# Patient Record
Sex: Male | Born: 1945 | Race: White | Hispanic: No | Marital: Married | Smoking: Former smoker
Health system: Southern US, Academic
[De-identification: ages and names within clinical notes are randomized; demographics above are authoritative.]

## PROBLEM LIST (undated history)

## (undated) DIAGNOSIS — M109 Gout, unspecified: Secondary | ICD-10-CM

## (undated) DIAGNOSIS — Z8669 Personal history of other diseases of the nervous system and sense organs: Secondary | ICD-10-CM

## (undated) DIAGNOSIS — R0602 Shortness of breath: Secondary | ICD-10-CM

## (undated) DIAGNOSIS — E7800 Pure hypercholesterolemia, unspecified: Secondary | ICD-10-CM

## (undated) DIAGNOSIS — E785 Hyperlipidemia, unspecified: Secondary | ICD-10-CM

## (undated) DIAGNOSIS — G629 Polyneuropathy, unspecified: Secondary | ICD-10-CM

## (undated) DIAGNOSIS — I10 Essential (primary) hypertension: Secondary | ICD-10-CM

## (undated) HISTORY — PX: HX APPENDECTOMY: SHX54

## (undated) HISTORY — PX: HX HIP REPLACEMENT: SHX124

## (undated) HISTORY — PX: HX CARPAL TUNNEL RELEASE: SHX101

## (undated) HISTORY — PX: CATARACT EXTRACTION W/  INTRAOCULAR LENS IMPLANT: SHX586

## (undated) NOTE — Patient Instructions (Signed)
Formatting of this note is different from the original.  Images from the original note were not included.      Earwax Blockage: Care Instructions  Your Care Instructions    Earwax is a natural substance that protects the ear canal. Normally, earwax drains from the ears and does not cause problems. Sometimes earwax builds up and hardens. Earwax blockage (also called cerumen impaction) can cause some loss of hearing and pain. When wax is tightly packed, you will need to have your doctor remove it.  Follow-up care is a key part of your treatment and safety. Be sure to make and go to all appointments, and call your doctor if you are having problems. It?s also a good idea to know your test results and keep a list of the medicines you take.  How can you care for yourself at home?   Do not try to remove earwax with cotton swabs, fingers, or other objects. This can make the blockage worse and damage the eardrum.   If your doctor recommends that you try to remove earwax at home:   Soften and loosen the earwax with warm mineral oil. You also can try hydrogen peroxide mixed with an equal amount of room temperature water. Place 2 drops of the fluid, warmed to body temperature, in the ear two times a day for up to 5 days.   Once the wax is loose and soft, all that is usually needed to remove it from the ear canal is a gentle, warm shower. Direct the water into the ear, then tip your head to let the earwax drain out. Dry your ear thoroughly with a hair dryer set on low. Hold the dryer several inches from your ear.   If the warm mineral oil and shower do not work, use an over-the-counter wax softener followed by gentle flushing with an ear syringe each night for a week or two. Make sure the flushing solution is body temperature. Cool or hot fluids in the ear can cause dizziness.  When should you call for help?  Call your doctor now or seek immediate medical care if:   Pus or blood drains from your ear.   Your ears are  ringing or feel full.   You have a loss of hearing.  Watch closely for changes in your health, and be sure to contact your doctor if:   You have pain or reduced hearing after 1 week of home treatment.   You have any new symptoms, such as nausea or balance problems.  Where can you learn more?  Log into your personal health record on [email or web address] and enter Q495 in the "Search Health Library" box to learn more about "Earwax Blockage: Care Instructions."     Not on [facility] yet? Visit [email or web address] and click the registration link to request an activation code.  Current as of: June 08, 2015  Content Version: 11.3   2006-2017 [employer]. Care instructions adapted under license by [facility]. If you have questions about a medical condition or this instruction, always ask your healthcare professional. [employer] disclaims any warranty or liability for your use of this information.    1. Excessive cerumen in right ear canal       Medication benefits, risks, and side effects discussed with patient/family. Patient/family verbalized understanding.     COMMUNICATION/DISCHARGE PLAN:   Debrox drops to soften wax   Take medications as prescribed for entire duration.    Use over-the-counter medication for other symptomatic relief.  Follow up with your primary care physician (No primary care provider on file.)for recheck as soon as possible   Return here or go to Emergency Department if new or worsening symptoms.     Electronically signed by Paula Libra, NP at 02/06/2016 11:04 AM EST

## (undated) NOTE — Progress Notes (Signed)
Formatting of this note is different from the original.  Images from the original note were not included.  Patient: Raymond Myers  MRN:   [id number]   DOB:   1945-09-10  PCP:  No primary care provider on file.     Raymond Myers is a 37 y.o. male who is being seen today for Otalgia (R ear pain (thinks hearing aid rubber tip may be stuck in ear) x 1 day)    HPI Comments: Pt states removed right hearing aid last night and cone was not attached. Pt sts is concerned it is still in his ear canal. Pt states decreased hearing and a feeling of fullness.     Foreign Body in Ear   Incident onset: yesterday. Suspected object: hearing aid cone. The foreign body is suspected to be in the right ear. The incident was reported. The incident was witnessed/reported by the patient. Associated symptoms include hearing loss (ear fullness). Pertinent negatives include no abdominal pain, chest pain, congestion, fever, vomiting or wheezing.     Allergies include:Ace inhibitors and Lisinopril    No outpatient encounter prescriptions on file as of 02/06/2016.     History reviewed. No pertinent past medical history.  History reviewed. No pertinent family history.  History reviewed. No pertinent surgical history.    History   Alcohol use Not on file     History   Smoking Status   ? Never Smoker   Smokeless Tobacco   ? Never Used     History   Drug Use Not on file     Review of Systems   Constitutional: Negative for chills, fatigue and fever.   HENT: Positive for hearing loss (ear fullness). Negative for congestion, ear discharge and ear pain.    Respiratory: Negative for chest tightness, shortness of breath and wheezing.    Cardiovascular: Negative for chest pain, palpitations and leg swelling.   Gastrointestinal: Negative for abdominal pain, nausea and vomiting.   Musculoskeletal: Negative for back pain and neck pain.   Skin: Negative for rash.   Neurological: Negative for dizziness, syncope and weakness.   Psychiatric/Behavioral: Negative  for confusion.     Depression Assessment Review:  No Data Recorded    Vital Signs & Measurements Taken This Encounter  Measurements Weight: 93.4 kg (206 lb), Height:  (185.4 cm), BSA (Calculated - sq m): 2.18 sq meters, BMI (Calculated): 27.2    Vitals BP: (!) 158/84, BP Location: Left arm, Patient Position: Sitting, Pulse: 89, Temp: 98.1 F (36.7 C), Temp src: Temporal    Pulse Oximetry SpO2: 95 % (RA)      No LMP for male patient.      Physical Exam   Constitutional: He is oriented to person, place, and time. He appears well-developed and well-nourished. No distress.   HENT:   Head: Normocephalic and atraumatic.   Right Ear: Tympanic membrane and external ear normal.   Left Ear: Tympanic membrane, external ear and ear canal normal.   Ears:    Nose: Nose normal.   Mouth/Throat: Uvula is midline, oropharynx is clear and moist and mucous membranes are normal.   Eyes: Conjunctivae and EOM are normal. Pupils are equal, round, and reactive to light.   Neck: Normal range of motion. Neck supple.   Cardiovascular: Normal rate, regular rhythm and normal heart sounds.    Pulmonary/Chest: Effort normal and breath sounds normal. No respiratory distress. He has no wheezes. He has no rales.   Musculoskeletal: Normal range of motion.  Lymphadenopathy:     He has no cervical adenopathy.   Neurological: He is alert and oriented to person, place, and time.   Skin: Skin is warm and dry. No rash noted.   Psychiatric: His behavior is normal.   Nursing note and vitals reviewed.    Assessment & Plan    Problem List Items Addressed This Visit     None     Visit Diagnoses     Excessive cerumen in right ear canal    -  Primary    Feared condition not demonstrated         Patient nontoxic appearing, afebrile, VS wnl.  No foreign body noted in right ear, patient with excessive cerumen.  Cerumen removed by curette without difficulty.  Patient states hearing improved.  Patient advised to follow up as needed. Patient verbalized  understanding, agrees with plan. Patient discharged ambulatory, stable from clinic.     Medication benefits, risks, and side effects discussed with patient/family. Patient/family verbalized understanding.     COMMUNICATION/DISCHARGE PLAN:   Debrox drops to soften wax   Take medications as prescribed for entire duration.    Use over-the-counter medication for other symptomatic relief.   Follow up with your primary care physician (No primary care provider on file.)for recheck as soon as possible   Return here or go to Emergency Department if new or worsening symptoms.     Return if symptoms worsen or fail to improve.    Referring Provider (if known): No ref. provider found  Electronically signed by Paula Libra, NP at 02/06/2016 11:22 AM EST

---

## 2013-01-31 DIAGNOSIS — K5730 Diverticulosis of large intestine without perforation or abscess without bleeding: Secondary | ICD-10-CM | POA: Insufficient documentation

## 2013-04-28 ENCOUNTER — Inpatient Hospital Stay (HOSPITAL_COMMUNITY): Payer: Self-pay

## 2016-06-02 DIAGNOSIS — Z9889 Other specified postprocedural states: Secondary | ICD-10-CM | POA: Insufficient documentation

## 2017-10-13 DIAGNOSIS — Z0000 Encounter for general adult medical examination without abnormal findings: Secondary | ICD-10-CM | POA: Insufficient documentation

## 2017-10-25 DIAGNOSIS — L718 Other rosacea: Secondary | ICD-10-CM | POA: Insufficient documentation

## 2017-10-25 DIAGNOSIS — I10 Essential (primary) hypertension: Secondary | ICD-10-CM | POA: Insufficient documentation

## 2017-10-25 DIAGNOSIS — E785 Hyperlipidemia, unspecified: Secondary | ICD-10-CM | POA: Insufficient documentation

## 2017-10-25 DIAGNOSIS — J309 Allergic rhinitis, unspecified: Secondary | ICD-10-CM | POA: Insufficient documentation

## 2017-10-25 DIAGNOSIS — M109 Gout, unspecified: Secondary | ICD-10-CM | POA: Insufficient documentation

## 2017-10-25 DIAGNOSIS — L711 Rhinophyma: Secondary | ICD-10-CM | POA: Insufficient documentation

## 2017-10-25 DIAGNOSIS — Z87891 Personal history of nicotine dependence: Secondary | ICD-10-CM | POA: Insufficient documentation

## 2017-10-25 DIAGNOSIS — Z789 Other specified health status: Secondary | ICD-10-CM | POA: Insufficient documentation

## 2017-11-01 DIAGNOSIS — D485 Neoplasm of uncertain behavior of skin: Secondary | ICD-10-CM | POA: Insufficient documentation

## 2020-11-30 DIAGNOSIS — G5602 Carpal tunnel syndrome, left upper limb: Secondary | ICD-10-CM | POA: Insufficient documentation

## 2021-09-06 DIAGNOSIS — Z872 Personal history of diseases of the skin and subcutaneous tissue: Secondary | ICD-10-CM | POA: Insufficient documentation

## 2021-09-06 DIAGNOSIS — L570 Actinic keratosis: Secondary | ICD-10-CM | POA: Insufficient documentation

## 2021-12-24 DIAGNOSIS — J3089 Other allergic rhinitis: Secondary | ICD-10-CM | POA: Insufficient documentation

## 2021-12-24 DIAGNOSIS — L500 Allergic urticaria: Secondary | ICD-10-CM | POA: Insufficient documentation

## 2022-02-25 DIAGNOSIS — S065X0A Traumatic subdural hemorrhage without loss of consciousness, initial encounter: Secondary | ICD-10-CM | POA: Insufficient documentation

## 2022-03-21 HISTORY — PX: BURR HOLE FOR SUBDURAL HEMATOMA: SHX1275

## 2022-06-21 DIAGNOSIS — I25118 Atherosclerotic heart disease of native coronary artery with other forms of angina pectoris: Secondary | ICD-10-CM

## 2022-07-05 ENCOUNTER — Inpatient Hospital Stay
Admission: RE | Admit: 2022-07-05 | Discharge: 2022-07-05 | Disposition: A | Discharge status: Home / Self Care | Payer: Medicare Other | Source: Ambulatory Visit | Attending: INTERVENTIONAL CARDIOLOGY | Admitting: INTERVENTIONAL CARDIOLOGY

## 2022-07-05 ENCOUNTER — Encounter (HOSPITAL_COMMUNITY)
Admission: RE | Disposition: A | Discharge status: Home / Self Care | Payer: Self-pay | Source: Ambulatory Visit | Attending: INTERVENTIONAL CARDIOLOGY

## 2022-07-05 ENCOUNTER — Other Ambulatory Visit: Payer: Self-pay

## 2022-07-05 ENCOUNTER — Encounter (HOSPITAL_COMMUNITY): Payer: Self-pay | Admitting: INTERVENTIONAL CARDIOLOGY

## 2022-07-05 DIAGNOSIS — R0602 Shortness of breath: Secondary | ICD-10-CM | POA: Insufficient documentation

## 2022-07-05 DIAGNOSIS — R0609 Other forms of dyspnea: Secondary | ICD-10-CM | POA: Insufficient documentation

## 2022-07-05 DIAGNOSIS — I2089 Other forms of angina pectoris (CMS HCC): Secondary | ICD-10-CM | POA: Diagnosis present

## 2022-07-05 DIAGNOSIS — I2510 Atherosclerotic heart disease of native coronary artery without angina pectoris: Secondary | ICD-10-CM

## 2022-07-05 DIAGNOSIS — I25118 Atherosclerotic heart disease of native coronary artery with other forms of angina pectoris: Secondary | ICD-10-CM | POA: Insufficient documentation

## 2022-07-05 HISTORY — DX: Pure hypercholesterolemia, unspecified: E78.00

## 2022-07-05 HISTORY — DX: Essential (primary) hypertension: I10

## 2022-07-05 LAB — CBC WITH DIFF
BASOPHIL #: 0.1 10*3/uL (ref 0.00–0.10)
BASOPHIL %: 1 % (ref 0–1)
EOSINOPHIL #: 0.1 10*3/uL (ref 0.00–0.50)
EOSINOPHIL %: 2 %
HCT: 42.8 % (ref 36.7–47.1)
HGB: 15.1 g/dL (ref 12.5–16.3)
LYMPHOCYTE #: 1.5 10*3/uL (ref 1.00–3.00)
LYMPHOCYTE %: 30 % (ref 16–44)
MCH: 31.8 pg (ref 23.8–33.4)
MCHC: 35.2 g/dL (ref 32.5–36.3)
MCV: 90.4 fL (ref 73.0–96.2)
MONOCYTE #: 0.4 10*3/uL (ref 0.30–1.00)
MONOCYTE %: 9 % (ref 5–13)
MPV: 7.5 fL (ref 7.4–11.4)
NEUTROPHIL #: 2.9 10*3/uL (ref 1.85–7.80)
NEUTROPHIL %: 58 % (ref 43–77)
PLATELETS: 185 10*3/uL (ref 140–440)
RBC: 4.74 10*6/uL (ref 4.06–5.63)
RDW: 14.2 % (ref 12.1–16.2)
WBC: 5 10*3/uL (ref 3.6–10.2)

## 2022-07-05 LAB — BASIC METABOLIC PANEL
ANION GAP: 9 mmol/L (ref 4–13)
BUN/CREA RATIO: 13 (ref 6–22)
BUN: 12 mg/dL (ref 7–25)
CALCIUM: 9.1 mg/dL (ref 8.6–10.3)
CHLORIDE: 108 mmol/L — ABNORMAL HIGH (ref 98–107)
CO2 TOTAL: 21 mmol/L (ref 21–31)
CREATININE: 0.93 mg/dL (ref 0.60–1.30)
ESTIMATED GFR: 84 mL/min/{1.73_m2} (ref >59)
GLUCOSE: 107 mg/dL (ref 74–109)
OSMOLALITY, CALCULATED: 276 mOsm/kg (ref 270–290)
POTASSIUM: 4.1 mmol/L (ref 3.5–5.1)
SODIUM: 138 mmol/L (ref 136–145)

## 2022-07-05 LAB — PTT (PARTIAL THROMBOPLASTIN TIME): APTT: 37 seconds (ref 25.0–38.0)

## 2022-07-05 LAB — PT/INR
INR: 1.02 (ref 0.84–1.10)
PROTHROMBIN TIME: 11.8 seconds (ref 9.8–12.7)

## 2022-07-05 LAB — MAGNESIUM: MAGNESIUM: 2.2 mg/dL (ref 1.9–2.7)

## 2022-07-05 SURGERY — CORONARY ANGIOGRAPHY W/LEFT HEART CATH W/WO LVG
Anesthesia: IV Sedation (Nurse Monitored)

## 2022-07-05 MED ORDER — ASPIRIN 81 MG CHEWABLE TABLET
CHEWABLE_TABLET | ORAL | Status: AC
Start: 2022-07-05 — End: 2022-07-05
  Filled 2022-07-05: qty 4

## 2022-07-05 MED ORDER — LIDOCAINE HCL 10 MG/ML (1 %) INJECTION SOLUTION
Freq: Once | INTRAMUSCULAR | Status: DC | PRN
Start: 2022-07-05 — End: 2022-07-05
  Administered 2022-07-05: 5 mL via INTRADERMAL

## 2022-07-05 MED ORDER — ATORVASTATIN 40 MG TABLET
20.0000 mg | ORAL_TABLET | Freq: Every evening | ORAL | 1 refills | Status: AC
Start: 2022-07-05 — End: ?

## 2022-07-05 MED ORDER — ADENOSINE (FFR) 180 MG IN 90 ML NS (TOT VOL) - 2 MG/ML INJECTION
INTRAVENOUS | Status: DC | PRN
Start: 2022-07-05 — End: 2022-07-05
  Administered 2022-07-05 (×2): 140 ug/kg/min via INTRAVENOUS
  Administered 2022-07-05 (×2): 0 via INTRAVENOUS

## 2022-07-05 MED ORDER — SODIUM CHLORIDE 0.9 % (FLUSH) INJECTION SYRINGE
3.0000 mL | INJECTION | Freq: Three times a day (TID) | INTRAMUSCULAR | Status: DC
Start: 2022-07-05 — End: 2022-07-05

## 2022-07-05 MED ORDER — NITROGLYCERIN 100 MCG/ML IN NS INJECTION
INJECTION | Freq: Once | INTRAVENOUS | Status: DC | PRN
Start: 2022-07-05 — End: 2022-07-05
  Administered 2022-07-05: 200 ug via INTRA_ARTERIAL

## 2022-07-05 MED ORDER — VERAPAMIL 2.5 MG/ML INTRAVENOUS SOLUTION
Freq: Once | INTRAVENOUS | Status: DC | PRN
Start: 2022-07-05 — End: 2022-07-05
  Administered 2022-07-05: 5 mg via INTRA_ARTERIAL

## 2022-07-05 MED ORDER — HEPARIN (PORCINE) 1,000 UNIT/ML INJECTION SOLUTION
Freq: Once | INTRAMUSCULAR | Status: DC | PRN
Start: 2022-07-05 — End: 2022-07-05
  Administered 2022-07-05: 5000 [IU] via INTRAVENOUS

## 2022-07-05 MED ORDER — SODIUM CHLORIDE 0.9 % INTRAVENOUS SOLUTION
INTRAVENOUS | Status: DC
Start: 2022-07-05 — End: 2022-07-05

## 2022-07-05 MED ORDER — SODIUM CHLORIDE 0.9 % (FLUSH) INJECTION SYRINGE
3.0000 mL | INJECTION | INTRAMUSCULAR | Status: DC | PRN
Start: 2022-07-05 — End: 2022-07-05

## 2022-07-05 MED ORDER — FENTANYL (PF) 50 MCG/ML INJECTION SOLUTION
INTRAMUSCULAR | Status: AC
Start: 2022-07-05 — End: 2022-07-05
  Filled 2022-07-05: qty 2

## 2022-07-05 MED ORDER — ASPIRIN 81 MG TABLET,DELAYED RELEASE
81.0000 mg | DELAYED_RELEASE_TABLET | Freq: Every day | ORAL | Status: DC
Start: 2022-07-05 — End: 2023-03-29

## 2022-07-05 MED ORDER — DIPHENHYDRAMINE 25 MG CAPSULE
25.0000 mg | ORAL_CAPSULE | ORAL | Status: AC
Start: 2022-07-05 — End: 2022-07-05
  Administered 2022-07-05: 25 mg via ORAL

## 2022-07-05 MED ORDER — MIDAZOLAM 5 MG/ML INJECTION WRAPPER
INTRAMUSCULAR | Status: AC
Start: 2022-07-05 — End: 2022-07-05
  Filled 2022-07-05: qty 1

## 2022-07-05 MED ORDER — DIPHENHYDRAMINE 25 MG CAPSULE
ORAL_CAPSULE | ORAL | Status: AC
Start: 2022-07-05 — End: 2022-07-05
  Filled 2022-07-05: qty 1

## 2022-07-05 MED ORDER — ADENOSINE (DIAGNOSTIC) 3 MG/ML INTRAVENOUS SOLUTION
INTRAVENOUS | Status: AC
Start: 2022-07-05 — End: 2022-07-05
  Filled 2022-07-05: qty 60

## 2022-07-05 MED ORDER — DIAZEPAM 5 MG TABLET
ORAL_TABLET | ORAL | Status: AC
Start: 2022-07-05 — End: 2022-07-05
  Filled 2022-07-05: qty 1

## 2022-07-05 MED ORDER — ASPIRIN 81 MG CHEWABLE TABLET
324.0000 mg | CHEWABLE_TABLET | Freq: Once | ORAL | Status: AC
Start: 2022-07-05 — End: 2022-07-05
  Administered 2022-07-05: 324 mg via ORAL

## 2022-07-05 MED ORDER — MIDAZOLAM 5 MG/ML INJECTION WRAPPER
Freq: Once | INTRAMUSCULAR | Status: DC | PRN
Start: 2022-07-05 — End: 2022-07-05
  Administered 2022-07-05 (×5): 1 mg via INTRAVENOUS

## 2022-07-05 MED ORDER — DIAZEPAM 5 MG TABLET
5.0000 mg | ORAL_TABLET | ORAL | Status: AC
Start: 2022-07-05 — End: 2022-07-05
  Administered 2022-07-05: 5 mg via ORAL

## 2022-07-05 MED ORDER — HEPARIN (PORCINE) (PF) 2,000 UNIT/1,000 ML IN 0.9 % SODIUM CHLORIDE IV
INTRAVENOUS | Status: AC
Start: 2022-07-05 — End: 2022-07-05
  Filled 2022-07-05: qty 2000

## 2022-07-05 MED ORDER — VERAPAMIL 2.5 MG/ML INTRAVENOUS SOLUTION
INTRAVENOUS | Status: AC
Start: 2022-07-05 — End: 2022-07-05
  Filled 2022-07-05: qty 2

## 2022-07-05 MED ORDER — SODIUM CHLORIDE 0.9 % INTRAVENOUS SOLUTION
INTRAVENOUS | Status: AC | PRN
Start: 2022-07-05 — End: 2022-07-05
  Administered 2022-07-05: 100 mL/h via INTRAVENOUS

## 2022-07-05 MED ORDER — LIDOCAINE HCL 10 MG/ML (1 %) INJECTION SOLUTION
INTRAMUSCULAR | Status: AC
Start: 2022-07-05 — End: 2022-07-05
  Filled 2022-07-05: qty 50

## 2022-07-05 MED ORDER — FENTANYL (PF) 50 MCG/ML INJECTION WRAPPER
INJECTION | Freq: Once | INTRAMUSCULAR | Status: DC | PRN
Start: 2022-07-05 — End: 2022-07-05
  Administered 2022-07-05 (×2): 25 ug via INTRAVENOUS

## 2022-07-05 SURGICAL SUPPLY — 16 items
CATH ANGIO 5FR PGTL ST CURVE 110CM PERFORMA MIV 6 SH RADOPQ BRD RADIAL PEBAX PLYCRB NYL STRL LF (CATHETERS) ×2 IMPLANT
CATH ANGIO 6FR RADIAL TIG 4 CURVE 100CM OPTITORQUE LRG LUM SH 2 BRD SFT TIP COR SS NYL POLYUR (CATHETERS) ×2 IMPLANT
CATH GD 6FR 100CM EBU3.5 CURVE SHR NX BAL SFT TIP DIST SLEEVE FLXB DIST SEG PERI TRNRDL HDPE STRL (CATHETERS) ×2 IMPLANT
CATH GD 6FR 100CM IR1 CURVE HEARTRAIL III LRG INNER LUM RADOPQ FLXB SHAFT COR PTFE (CATHETERS) ×2 IMPLANT
COVER PROBE 58X5.5IN TELESCOPICAL FOLD ELAS BAND GEL PKT STRL LF  CIV-FLX (MED SURG SUPPLIES) ×2 IMPLANT
DCNTR FLUID DISPENSR BAG BAJ DISP STRL LF  ASPT TRANSF (IV TUBING & ACCESSORIES) ×2 IMPLANT
DEVICE COMPRESS TR BAND 24CM 2 BAL TRNSPR ADJ STRAP REG RADIAL ART (MED SURG SUPPLIES) ×2 IMPLANT
GW EMRLD .035IN 7CM 260CM FLXB END FIX COR EXCH PTFE VAS PERC 3MM RAD J CURVE DISP (WIRE) ×2 IMPLANT
GW GLDWR .035IN 3CM 150CM FLXB ANG TIP RADOPQ KINK RST NITINOL TUNG POLYUR HDRPH VAS STD (WIRE) ×2 IMPLANT
GW RUNTHROUGH .36MM 3CM 180CM RADOPQ X FLPY VAS STRL LF  DISP PTCA (WIRE) ×2 IMPLANT
KIT INTROD 10CM 6FR 22GA GLIDESHEATH SLNDR .021IN PLASTIC SHEATH DIL 2 WL PNCT SHORT ANG MINIWIRE 45 (INTRODUCER) ×2 IMPLANT
MCTH PRESS 150CM NAVVUS II STRL LF  DISP (VASCULAR) ×2 IMPLANT
SCATPAD RAD SHIELD 145 X 165 (MED SURG SUPPLIES) ×2 IMPLANT
SET ADMN MERIT MEDICAL SYS INJ MED CNRST S DISP (VASCULAR) ×2 IMPLANT
TUBING PROCEDURE 72IN RADGR STRL DISP (MED SURG SUPPLIES) ×2 IMPLANT
VALVE HMSTS .096- IN COPLT KIT BLDBCK CONTROL VALVE INTROD TORQUE DEV GW (VASCULAR) ×2 IMPLANT

## 2022-07-05 NOTE — Discharge Instructions (Addendum)
DO NOT LIFT PUSH OR PULL MORE THAN 2 POUNDS FOR 2 DAYS    IF YOU START TO BLEED FROM YOUR WRIST OR DEVELOP A HEMATOMA, HOLD PRESSURE AT THE PROCEDURE SITE AND GET TO AN ER

## 2022-07-05 NOTE — Nurses Notes (Signed)
Reviewed 2 lb restriction with patient multiple times; reinforced with patient and his wife what to do if he starts bleeding or develops a hematoma. Both verbalized understanding. Written instructions and education provided.  Out of facility in wheelchair with all belongings including home medications at 1625

## 2022-07-05 NOTE — Progress Notes (Signed)
07/05/22  Procedure(s):  CORONARY ANGIOGRAPHY W/LEFT HEART CATH W/WO LVG  CARD US GUIDED ACCESS  CORONARY FLOW MEASUREMENT - INITIAL VESSEL    Diagnosis:     Sedation Informed Consent, pre-sedation risk assessment and evaluation completed.  History of previous adverse experiences with sedation/analgesia/anesthesia assessed.  Monitored conscious sedation was administered under my direct supervision by an appropriately trained sedation nurse.  Appropriate Facility and Equipment compliant.      Procedure time out  Timeouts       Jeral Fruit, RN at [facility] Jul 05, 2022 1219 EDT       Timeout Details       Timeout type: Preprocedure              Procedures       Panel 1: CORONARY ANGIOGRAPHY W/LEFT HEART CATH W/WO LVG with Skylan Lara, Jeannett Senior, MD    Panel 1: CARD US GUIDED ACCESS with Becker Christopher, Jeannett Senior, MD              Timeout Questions    Correct patient? Yes  Correct site? Yes  Correct side? N/A  Correct position? Yes  Correct procedure? Yes  Site marked? N/A  H&P note completed? Yes  Consents verified? Yes  Radiology studies available? Yes  Relevant lab results available? Yes  Allergies reviewed? Yes  Are all required blood products & devices for the procedure available? Yes  Is documentation verified? Yes             Staff Present       Physicians  Gregory Barrick, Jeannett Senior, MD Staff  Dillow-Price, Shanda Bumps, RN  Jeral Fruit, RN  Unice Bailey, RT (Elvera Lennox)  Binnie Rail, RN  Lenise Arena, RN              Signing History       Staff Performed Signed    Jeral Fruit, RN Tue Jul 05, 2022 1219 EDT Tue Jul 05, 2022 1219 EDT                            Physician in  and out times  Physicians       Name Panel Role Time Period    Carolanne Mercier, Jeannett Senior, MD Panel 1 Primary 07/05/2022 1155 - 07/05/2022 1311          Sedation Staff       Name Type Time Period    Dillow-Price, Shanda Bumps, RN Invasive Nurse 07/05/2022 1155 - 07/05/2022 1311            Sedation and Procedure Times:  Event Time In   CV Sedation Start 1209   CV Sedation Stop 1302          Proc  Name Event Type Event Time    CORONARY ANGIOGRAPHY W/LEFT HEART CATH W/WO LVG  Incision Start Tue Jul 05, 2022 12:24 PM    CORONARY ANGIOGRAPHY W/LEFT HEART CATH W/WO LVG  Incision Close Tue Jul 05, 2022  1:03 PM    CARD US GUIDED ACCESS  Incision Start Tue Jul 05, 2022 12:24 PM    CARD US GUIDED ACCESS  Incision Close Tue Jul 05, 2022  1:03 PM    CORONARY FLOW MEASUREMENT - INITIAL VESSEL  Incision Start Tue Jul 05, 2022 12:42 PM    CORONARY FLOW MEASUREMENT - INITIAL VESSEL  Incision Close Tue Jul 05, 2022  1:03 PM            Aldrete Scores    Pre Sedation  Activity: 2-->able to move 4 extremities voluntarily or on command  Respiration: 2-->able to breathe and cough freely  Circulation: 2-->BP within 20% of pre-anesthetic level  Consciousness: 2-->fully awake  O2 Saturation: 2-->able to maintain O2 saturation greater than 92% on room air  Dressing: 2-->dry and clean or not applicable  Pain: 2-->pain free  Ambulation: 2-->able to stand up and walk straight, on ordered bedrest, or performing at previous level of functioning  Fasting/Feeding: 2-->able to drink fluids or NPO, minimal nausea/ no vomiting  Urine Output: 2-->has voided, adequate urine output per device, or not applicable  Modified Aldrete Score: 20      Post Sedation                                       Sedation Type: (P) Moderate Sedation     Medications (moderate): (P) Fentanyl, Versed     Unit: (P) [facility] Invasive [facility]  IV Type: (P) Peripheral IV        Effects of Administration: (P) Successful sedation w/o adverse events       Patient was continuously monitored throughout the procedure.  Provider was in attendance throughout sedation.  See Invasive Procedure Log for additional details.    Elam Dutch, MD    Blood loss: minimal  Samples: N/A  Complications: none

## 2022-07-05 NOTE — H&P (Signed)
[facility]  H&P Update Form    Johnney, Scarlata, 77 y.o. male  Encounter Start Date:  07/05/2022  Inpatient Admission Date:   Date of Birth:  03-09-1946    07/05/2022    STOP: IF H&P IS GREATER THAN 30 DAYS FROM SURGICAL DAY COMPLETE NEW H&P IS REQUIRED.     H & P updated the day of the procedure.  1.  H&P completed within 30 days of surgical procedure and has been reviewed within 24 hours of admission but prior to surgery or a procedure requiring anesthesia services by Dr. Alethia Berthold within 30 days of procedure , the patient has been examined, and no change has occured in the patients condition since the H&P was completed.       Change in medications: No                   2.  Patient continues to be appropiate candidate for planned surgical procedure. YES      Elam Dutch, MD

## 2022-07-12 LAB — INVASIVE CARDIOLOGY PROCEDURE: Cath EF Quantitative: 65 %

## 2022-08-18 DIAGNOSIS — I440 Atrioventricular block, first degree: Secondary | ICD-10-CM

## 2022-08-18 DIAGNOSIS — I4510 Unspecified right bundle-branch block: Secondary | ICD-10-CM

## 2022-08-18 DIAGNOSIS — R001 Bradycardia, unspecified: Secondary | ICD-10-CM

## 2022-08-18 DIAGNOSIS — R9431 Abnormal electrocardiogram [ECG] [EKG]: Secondary | ICD-10-CM

## 2022-08-18 LAB — ECG 12 LEAD
Atrial Rate: 50 {beats}/min
Calculated P Axis: 66 degrees
Calculated R Axis: -83 degrees
Calculated T Axis: 41 degrees
PR Interval: 226 ms
QRS Duration: 140 ms
QT Interval: 498 ms
QTC Calculation: 454 ms
Ventricular rate: 50 {beats}/min

## 2022-10-11 IMAGING — MR MRI CERVICAL SPINE WITHOUT CONTRAST
4 of 5 series · 22 of 48 positions shown · IV contrast (gadolinium)
Comparison: None available.

﻿EXAM:  [id number]   MRI CERVICAL SPINE WITHOUT CONTRAST
INDICATION: 77-year-old with bilateral shoulder pain and neck pain.  Sustained trauma due to fall in February 2022.  No history of surgery on C-spine.
TECHNIQUE: Multiplanar, multisequential MRI of the C-spine was performed without gadolinium contrast.

[Series 5: T2 · sagittal · 3.0mm · 0.75mm/px · 8 of 13 slices shown (1 of 2)]
[im 1/13]
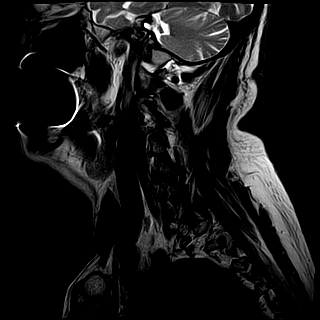
[im 2/13]
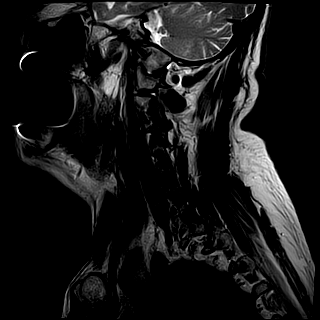
[im 4/13]
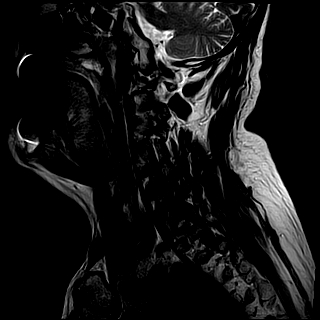
[im 6/13]
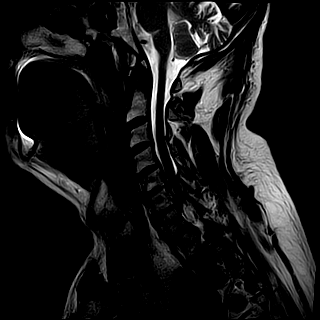
[im 7/13]
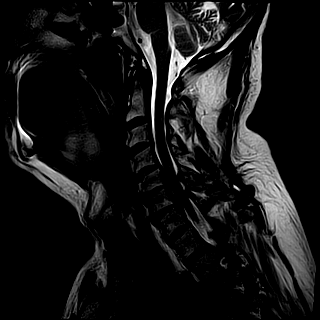
[im 9/13]
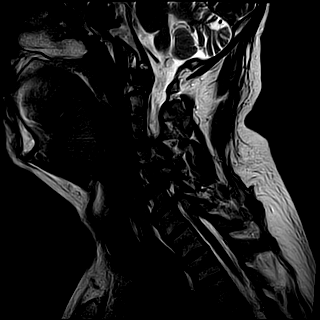
[im 11/13]
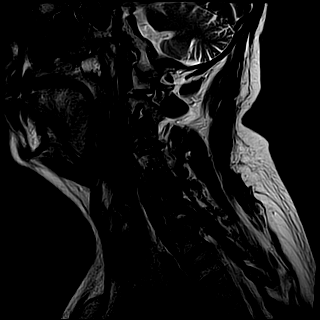
[im 13/13]
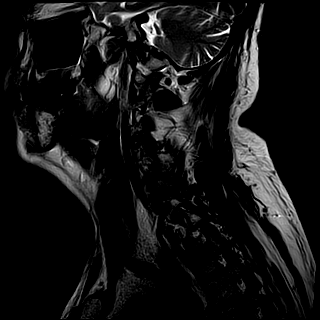

[Series 6: T1 · sagittal · 3.0mm · 0.47mm/px · 3 of 13 slices shown]
[im 2/13]
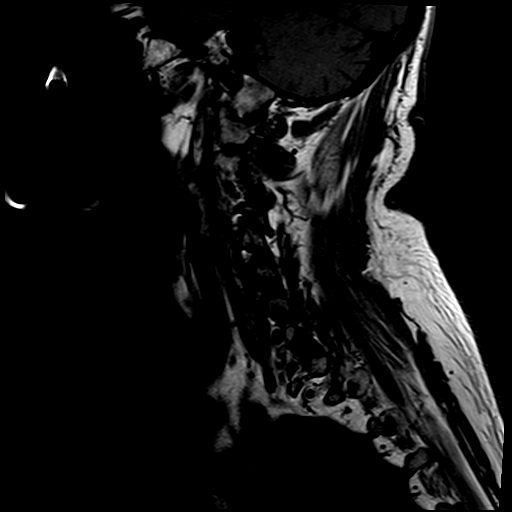
[im 7/13]
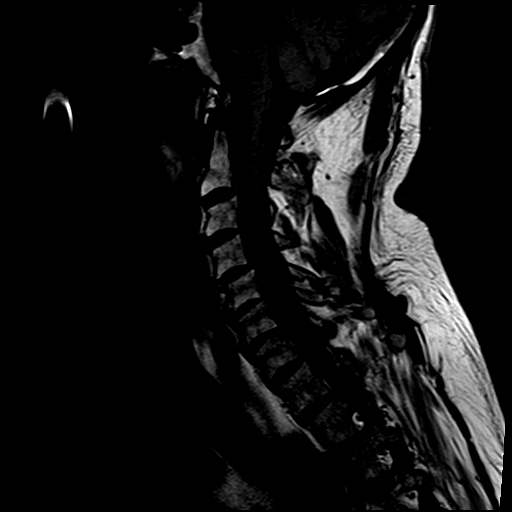
[im 11/13]
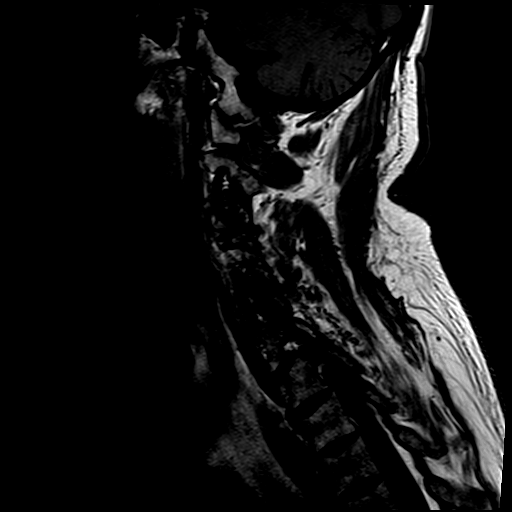

[Series 7: STIR · sagittal · 3.0mm · 0.47mm/px · 3 of 13 slices shown]
[im 2/13]
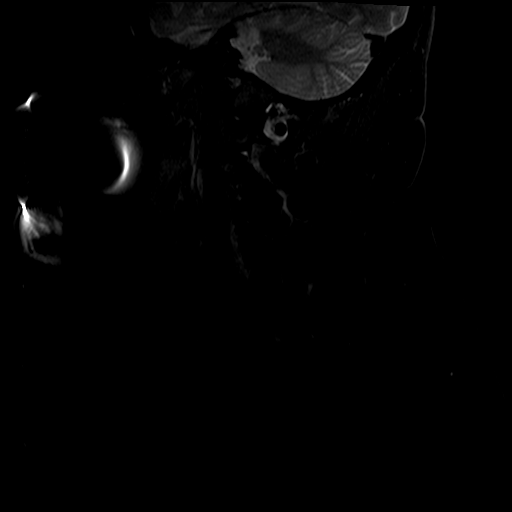
[im 7/13]
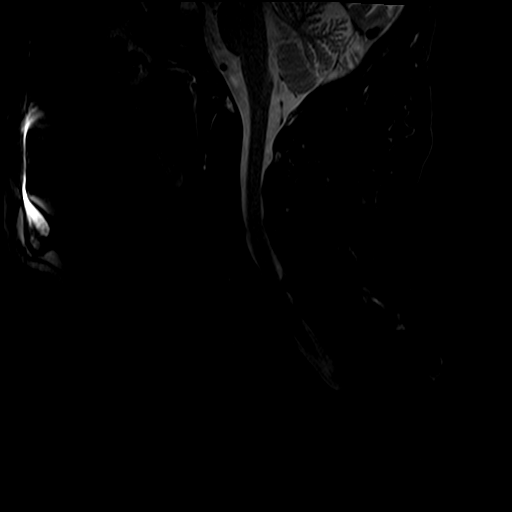
[im 11/13]
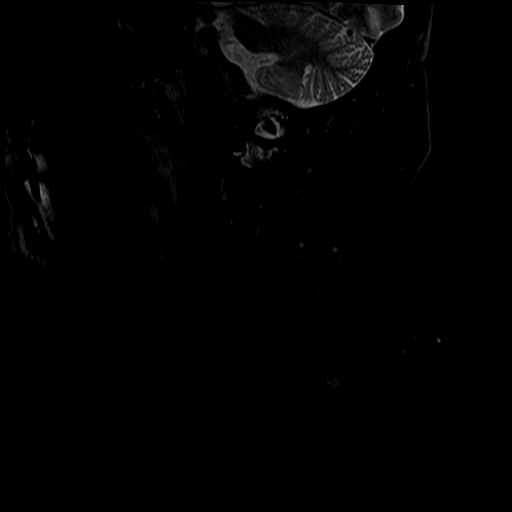

[Series 8: T2 · axial · 3.0mm · 0.39mm/px · z∈[-98,+10]mm · 8 of 18 slices shown (2 of 2)]
[im 1/18]
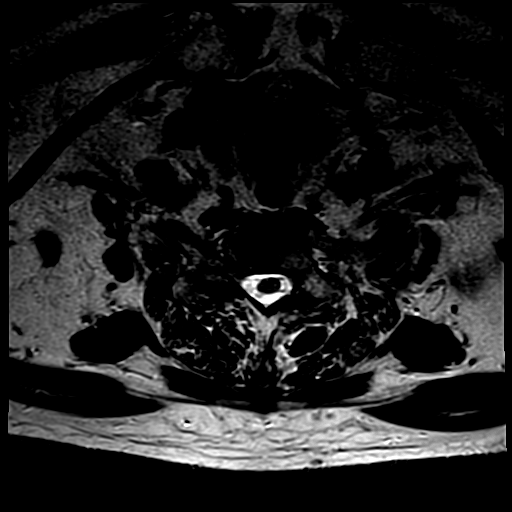
[im 4/18]
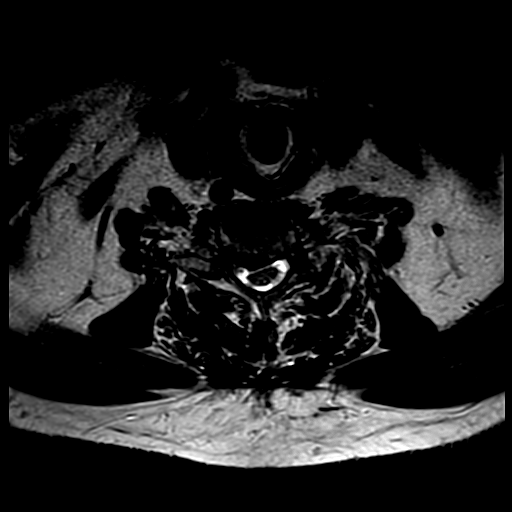
[im 5/18]
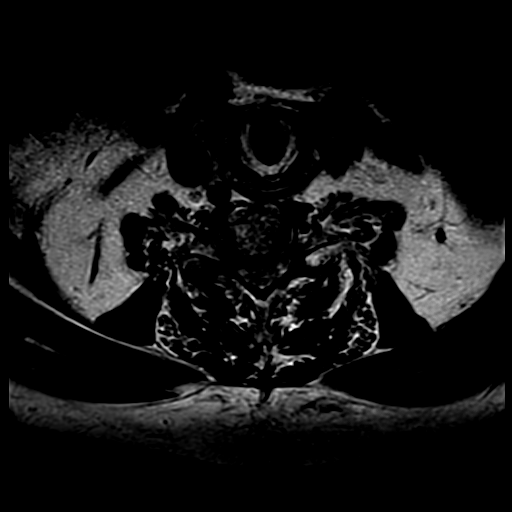
[im 8/18]
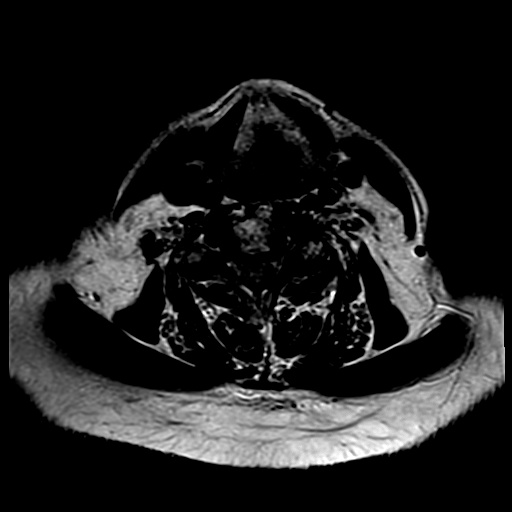
[im 10/18]
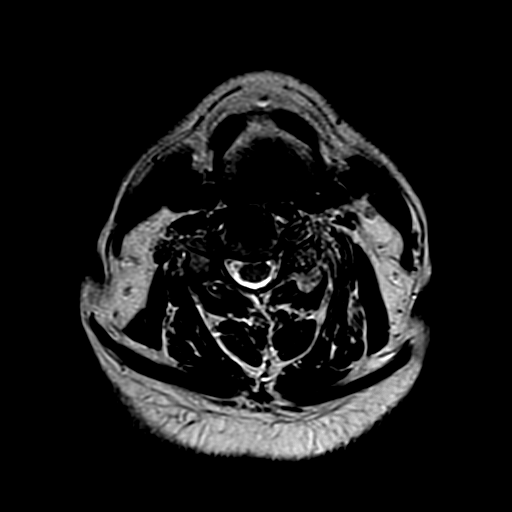
[im 13/18]
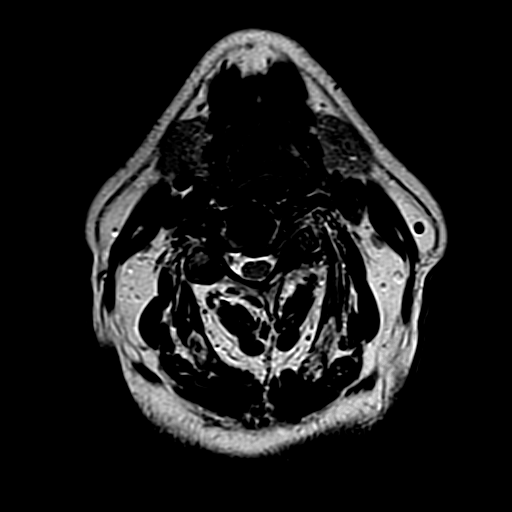
[im 14/18]
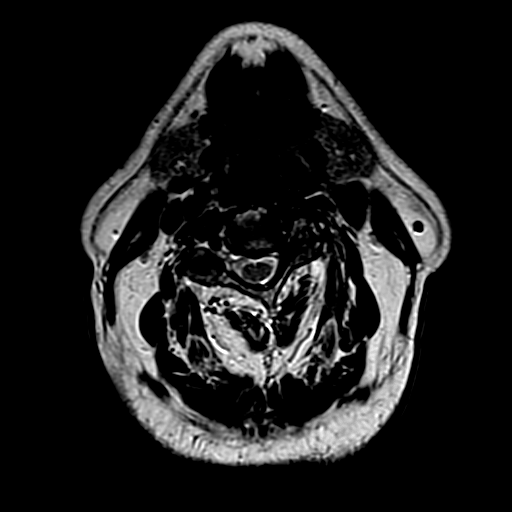
[im 16/18]
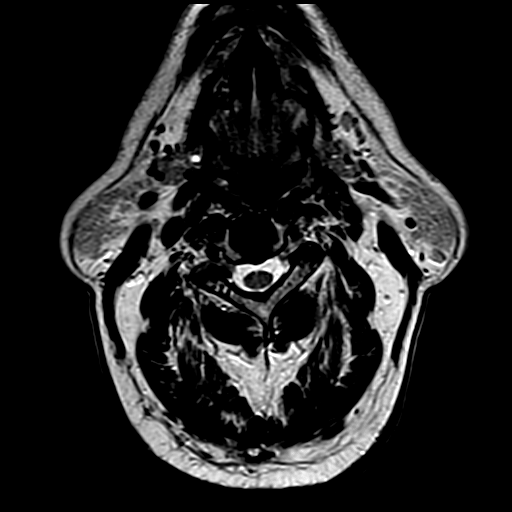

[22 of 48 positions shown; findings below may reference images not displayed]

FINDINGS: No acute bony lesions of cervical vertebrae are seen.  Structures at the foramen magnum are normal in the sagittal projection. 

At C2-3 level, no significant focal disc pathology. Tiny central disc protrusion without compromise of thecal sac or neural foramina. 

At C3-4 level, no significant focal disc lesions are seen.

At C4-5 level, degenerative disc disease with asymmetric bulging annulus to the left with osteophyte complex causing significant left foraminal narrowing at this level.

At C5-6 level, significant degenerative disc disease is noted along with disc osteophyte complex and facet arthropathy causing significant compromise of both neural foramina.  Moderate compromise of thecal sac with AP diameter in the midline measuring 7.9 mm.  The disc changes and dorsal ligament hypertrophy are impinging on the cervical cord at this level.

At C6-7 level, significant degenerative disc disease with bulging annulus and osteophyte complex are noted along with facet arthropathy causing severe compromise of right lateral recess and neural foramen, significant compromise of left neural foramen and compromise of thecal sac with spinal stenosis with AP diameter in the midline measuring 6.5 mm.  These changes are impinging on the cervical spinal cord at this level.

C7-T1 disc shows no focal lesions.  Hypertrophic changes of the facet on the left side are mildly impinging on the left lateral recess.

Cervical spinal cord shows no evidence of cord edema or syrinx formation.
IMPRESSION: 1. No acute or focal bone changes of cervical vertebrae. 

2. At C5-6 level, significant degenerative disc disease is noted along with disc osteophyte complex and facet arthropathy causing significant compromise of both neural foramina.  Moderate compromise of thecal sac with AP diameter in the midline measuring 7.9 mm.  The disc changes and dorsal ligament hypertrophy are impinging on the cervical cord at this level.

3. At C6-7 level, significant degenerative disc disease with bulging annulus and osteophyte complex are noted along with facet arthropathy causing severe compromise of right lateral recess and neural foramen, significant compromise of left neural foramen and compromise of thecal sac with spinal stenosis with AP diameter in the midline measuring 6.5 mm.  These changes are impinging on the cervical spinal cord at this level.

4. Findings at other disc levels are described above in detail.

5. Cervical spinal cord shows no evidence of myelomalacia or syrinx formation.

## 2022-10-11 IMAGING — MR MRI BRAIN W/O CONTRAST
9 series · 48 of 48 positions shown · non-contrast
Comparison: Outside MRI dated 02/25/2022.  Outside CT head dated 04/04/2022.

﻿EXAM:  MRI BRAIN W/O CONTRAST
INDICATION: 77-year-old male with headache. Patient sustained trauma with diagnosis of right-sided subdural hematoma in February 2022, status post burr hole placement and drainage.
TECHNIQUE: Multiplanar, multisequential MRI of the brain was performed without administration of contrast

[Series 5: DWI · axial · 5.0mm · 1.35mm/px · z∈[-54,+72]mm · 15 of 88 slices shown (1 of 3)]
[im 1/88]
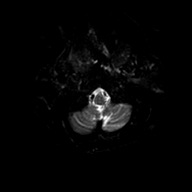
[im 7/88]
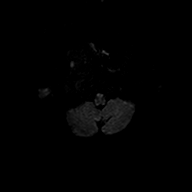
[im 13/88]
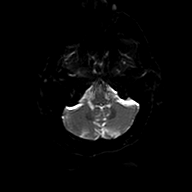
[im 19/88]
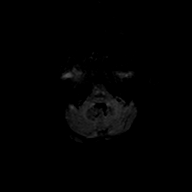
[im 25/88]
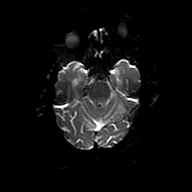
[im 32/88]
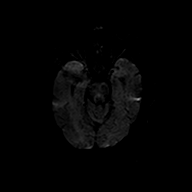
[im 38/88]
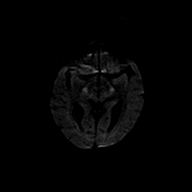
[im 44/88]
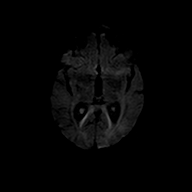
[im 50/88]
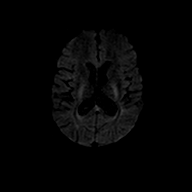
[im 56/88]
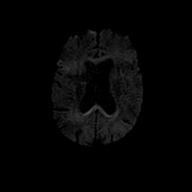
[im 63/88]
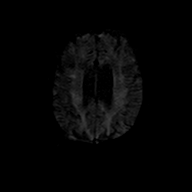
[im 69/88]
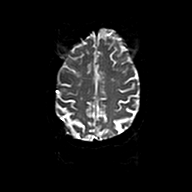
[im 75/88]
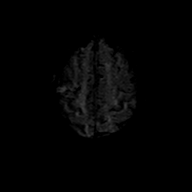
[im 81/88]
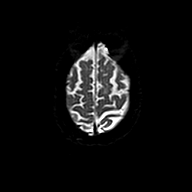
[im 88/88]
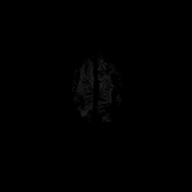

[Series 6: DWI · axial · 5.0mm · 1.35mm/px · z∈[-54,+72]mm · 4 of 22 slices shown (2 of 3)]
[im 1/22]
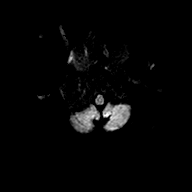
[im 8/22]
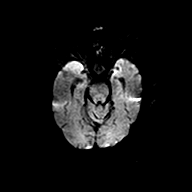
[im 15/22]
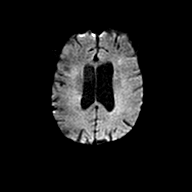
[im 22/22]
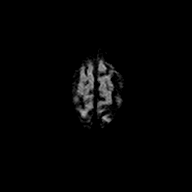

[Series 7: DWI · axial · 5.0mm · 1.35mm/px · z∈[-54,+72]mm · 4 of 22 slices shown (3 of 3)]
[im 1/22]
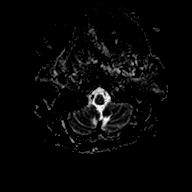
[im 8/22]
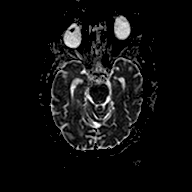
[im 15/22]
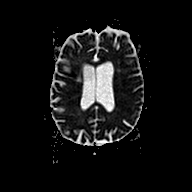
[im 22/22]
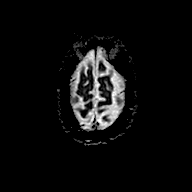

[Series 8: FLAIR · sagittal · 4.0mm · 0.75mm/px · 5 of 26 slices shown (1 of 2)]
[im 1/26]
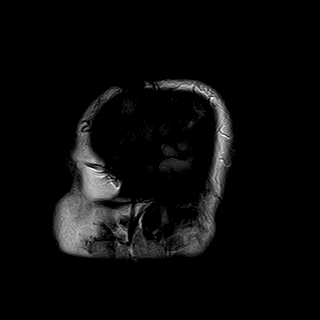
[im 7/26]
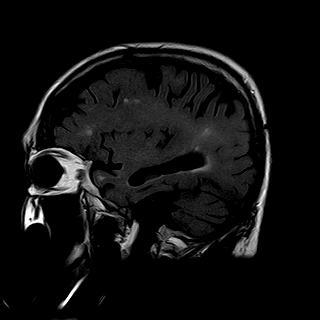
[im 13/26]
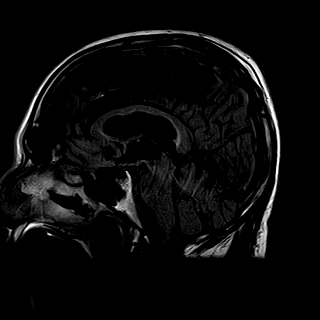
[im 19/26]
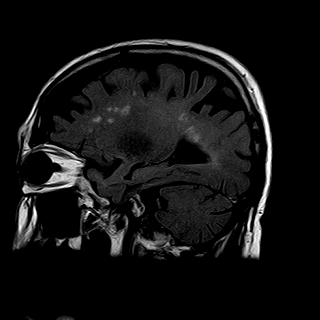
[im 26/26]
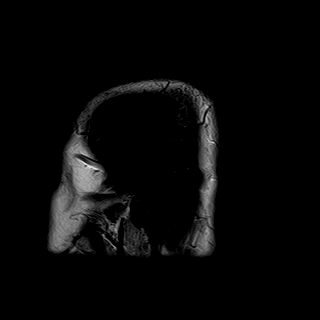

[Series 9: T2 · axial · 5.0mm · 0.43mm/px · z∈[-64,+80]mm · 4 of 25 slices shown (1 of 2)]
[im 1/25]
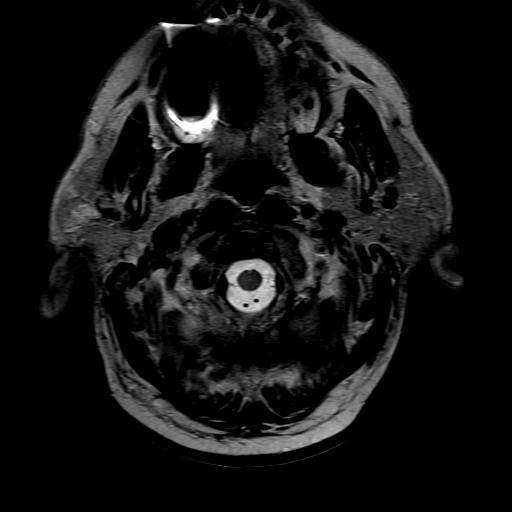
[im 9/25]
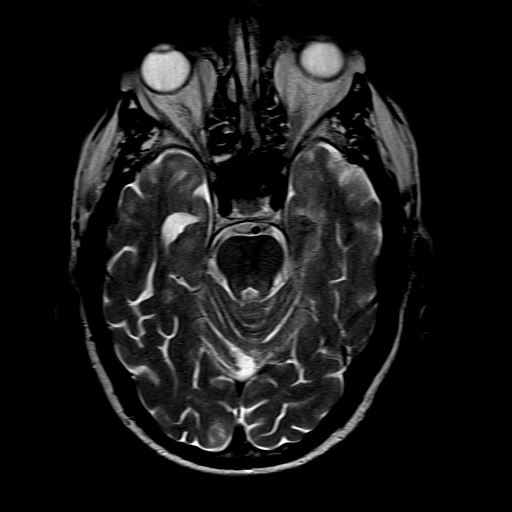
[im 17/25]
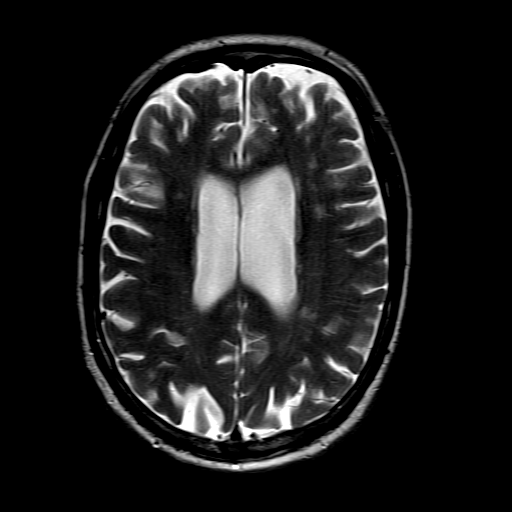
[im 25/25]
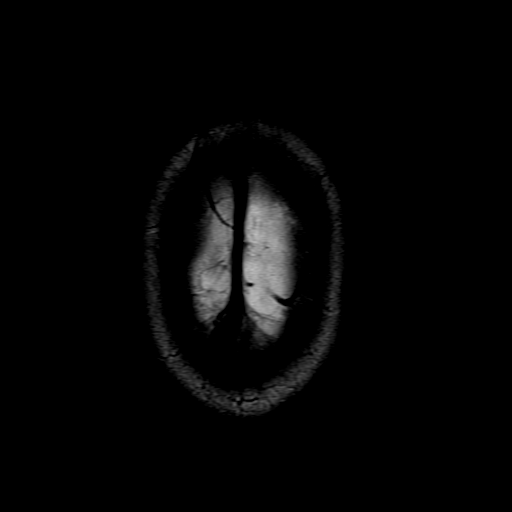

[Series 10: FLAIR · axial · 5.0mm · 0.76mm/px · z∈[-55,+71]mm · 4 of 22 slices shown (2 of 2)]
[im 1/22]
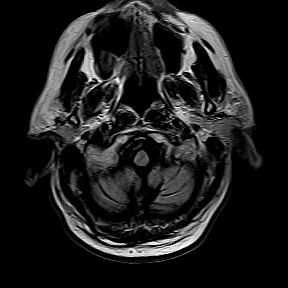
[im 8/22]
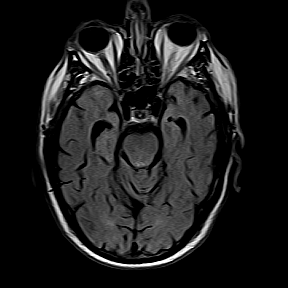
[im 15/22]
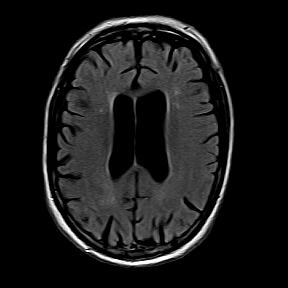
[im 22/22]
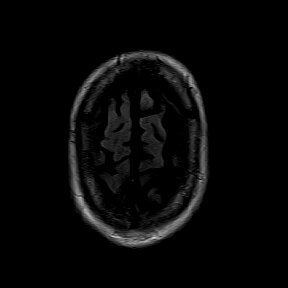

[Series 11: T1 · axial · 5.0mm · 0.69mm/px · z∈[-55,+71]mm · 4 of 22 slices shown]
[im 1/22]
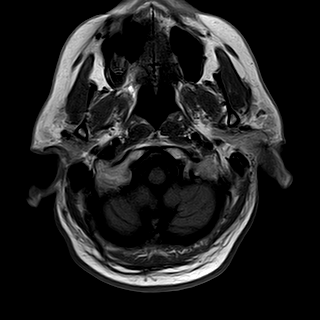
[im 8/22]
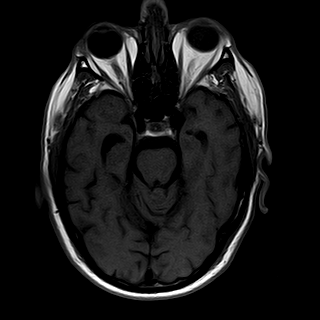
[im 15/22]
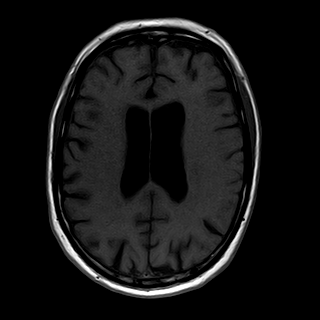
[im 22/22]
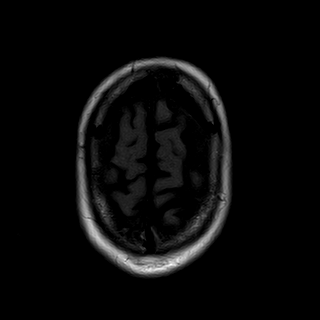

[Series 12: T2-star · axial · 5.0mm · 0.69mm/px · z∈[-55,+71]mm · 4 of 22 slices shown]
[im 1/22]
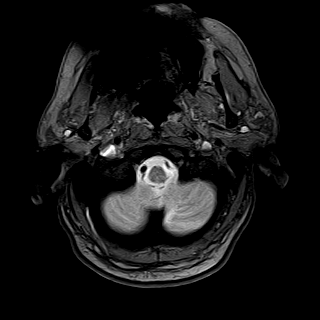
[im 8/22]
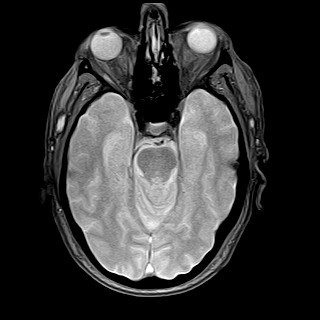
[im 15/22]
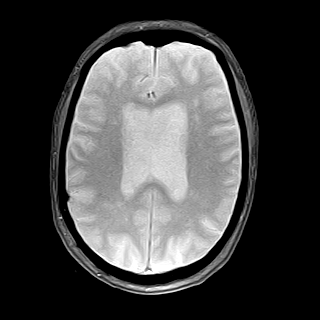
[im 22/22]
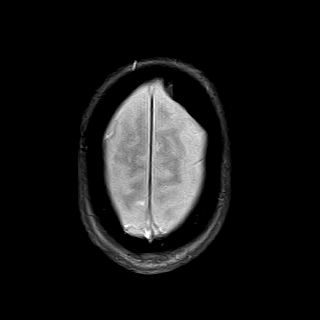

[Series 13: T2 · coronal · 6.0mm · 0.43mm/px · 4 of 24 slices shown (2 of 2)]
[im 1/24]
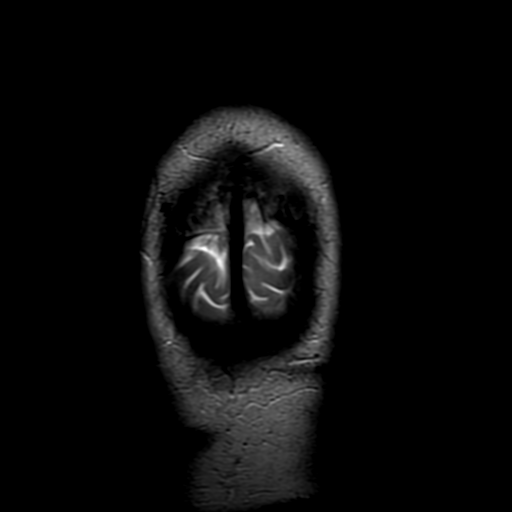
[im 8/24]
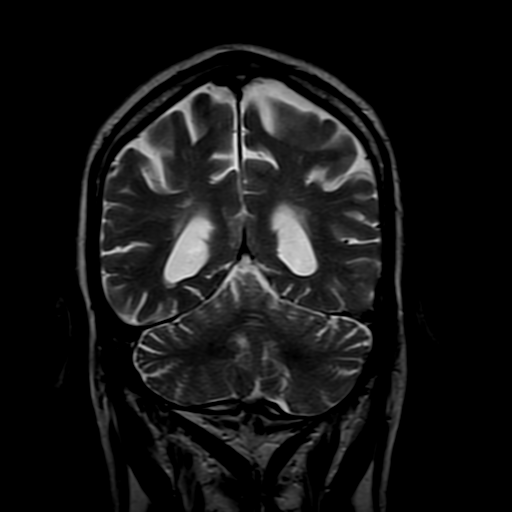
[im 16/24]
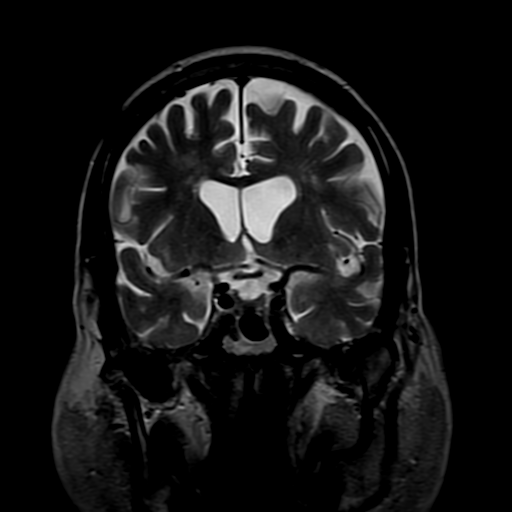
[im 24/24]
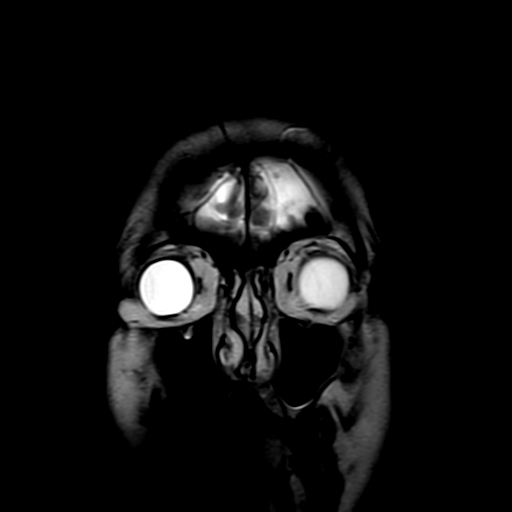

[48 of 48 positions shown; findings below may reference images not displayed]

FINDINGS: No focal areas of restricted diffusion.  No evidence of intracranial bleed or extra-axial collections are seen at this time.  Postoperative changes of burr hole placement on the right side are noted.  Age-appropriate symmetric cerebral cortical atrophy and chronic small-vessel ischemic change of periventricular and subcortical white matter.

Major arteries of circle of Willis and dural venous sinuses are patent.  Hyperostosis of left frontal bone similar to prior imaging studies.  No focal lesions of the posterior fossa.
IMPRESSION: 1. Postsurgical changes of burr hole placement on the right side.  No residual intracranial space-occupying lesions or extra-axial collections are seen.

2. Age-appropriate symmetric cerebral cortical atrophy. Chronic small-vessel ischemic change of periventricular white matter.

## 2023-03-27 ENCOUNTER — Encounter (INDEPENDENT_AMBULATORY_CARE_PROVIDER_SITE_OTHER): Payer: Self-pay | Admitting: Surgery

## 2023-03-29 ENCOUNTER — Other Ambulatory Visit: Payer: Self-pay

## 2023-03-29 ENCOUNTER — Ambulatory Visit (INDEPENDENT_AMBULATORY_CARE_PROVIDER_SITE_OTHER): Payer: 59 | Admitting: Surgery

## 2023-03-29 ENCOUNTER — Encounter (INDEPENDENT_AMBULATORY_CARE_PROVIDER_SITE_OTHER): Payer: Self-pay | Admitting: Surgery

## 2023-03-29 VITALS — BP 151/77 | HR 67 | Temp 97.6°F | Ht 73.0 in | Wt 210.8 lb

## 2023-03-29 DIAGNOSIS — Z800 Family history of malignant neoplasm of digestive organs: Secondary | ICD-10-CM

## 2023-03-29 DIAGNOSIS — R194 Change in bowel habit: Secondary | ICD-10-CM

## 2023-03-29 DIAGNOSIS — R197 Diarrhea, unspecified: Secondary | ICD-10-CM

## 2023-03-30 ENCOUNTER — Encounter (INDEPENDENT_AMBULATORY_CARE_PROVIDER_SITE_OTHER): Payer: Self-pay | Admitting: Surgery

## 2023-03-30 NOTE — H&P (Signed)
Office History and Physical      Reason for Visit: Colonoscopy    History of Present Illness  [name] presents as a referral by [facility] for evaluation of a colonoscopy with family history of colon cancer and personal history of diarrhea with a change in bowel habits.  Symptoms currently partially controlled through the use of Imodium.  Last colonoscopy was sometime ago.  No change in medications.  No nausea or vomiting        I have reviewed the patient's provided medical records and diagnostic testing including laboratory values, imaging results, documented encounters and providers notes with all pertinent information noted with respect to today's evaluation serving as unique tests and sources as a component of the medical decision making process for this encounter relevant to the patients independent evaluation by me today.        Patient Data  Patient History  Past Medical History:   Diagnosis Date    Hypercholesterolemia     Hypertension          Past Surgical History:   Procedure Laterality Date    BURR HOLE FOR SUBDURAL HEMATOMA  03/2022    HX APPENDECTOMY      HX CARPAL TUNNEL RELEASE Bilateral     HX HIP REPLACEMENT           Current Outpatient Medications   Medication Sig    allopurinoL (ZYLOPRIM) 300 mg Oral Tablet Take 1 Tablet (300 mg total) by mouth Once a day    atorvastatin (LIPITOR) 40 mg Oral Tablet Take 0.5 Tablets (20 mg total) by mouth Every evening Indications: hardening of the arteries due to plaque buildup    cholecalciferol, vitamin D3, 25 mcg (1,000 unit) Oral Tablet Take 1 Tablet (1,000 Units total) by mouth Once a day    donepeziL (ARICEPT) 10 mg Oral Tablet Take 2 Tablets (20 mg total) by mouth Every night    doxazosin (CARDURA) 8 mg Oral Tablet Take 1 Tablet (8 mg total) by mouth Once a day    DULoxetine (CYMBALTA DR) 60 mg Oral Capsule, Delayed Release(E.C.) Take 1 Capsule (60 mg total) by mouth Once a day    famotidine (PEPCID) 40 mg  Oral Tablet Take 1 Tablet (40 mg total) by mouth Twice daily    loratadine (CLARITIN) 10 mg Oral Tablet Take 1 Tablet (10 mg total) by mouth Once a day    multivitamin with folic acid (THERA) 400 mcg Oral Tablet Take 1 Tablet by mouth Once a day    NETTLE LEAF ORAL Take 2 Capsules by mouth Twice daily    potassium chloride (K-DUR) 20 mEq Oral Tab Sust.Rel. Particle/Crystal Take 1 Tablet (20 mEq total) by mouth    terbinafine HCL (LAMISIL AT) 1 % Cream Apply topically     Allergies   Allergen Reactions    Indomethacin Anaphylaxis     Other Reaction(s): Not available    Tongue, Face, lips swollen, no shortness of breath    Lisinopril Swelling    Sulfa (Sulfonamides) Rash     Family Medical History:       Problem Relation (Age of Onset)    Heart Attack Father            Social History     Tobacco Use    Smoking status: Former     Types: Cigarettes    Smokeless tobacco: Current     Types: Chew   Substance Use Topics    Alcohol use:  Yes    Drug use: Never        The above documented section regarding past medical, past surgical, family, and social history (PMFSH) has been reviewed and considered and to the best of my knowledge represents a valid and accurate reflection of the patient's previous pertinent experiences documented by multiple providers and participants of the EMR.I cannot attest to all entries but do no recognize any gross inaccuracies as the data is a common field across all providers  Further history pertinent to the current encounter will be found as referenced       Physical Examination:    Vitals:    03/29/23 0953   BP: (!) 151/77   Pulse: 67   Temp: 36.4 C (97.6 F)   SpO2: 97%   Weight: 95.6 kg (210 lb 12.8 oz)   Height: 1.854 m (6\' 1" )   BMI: 27.81          General:appropriate for age. in no acute distress.  HEENT:Atraumatic, Normocephalic.   Lungs:Nonlabored breathing with symmetric expansion  Heart:Regular wth respect to rate and rythmn.    Abdomen:Soft. Nontender. Nondistended  Bowel sounds are  present. No peritoneal signs  Skin:No Rashes. No ulcers. Skin warm  Psychiatric:Alert and oriented to person, place, and time. affect appropriate        Diagnosis:    ICD-10-CM    1. Family history of colon cancer  Z80.0       2. Diarrhea  R19.7       3. Change in bowel habits  R19.4           Plan:    Discussed indications, risks and benefits of colonoscopy with the patient.  Discussed the possibility of polypectomy, biopsies, and possible repeat examinations.  Risks include bleeding, sedation risks, possibility of missed diagnosis of polyp or malignancy, and remote possibilities of perforation and death.  All questions were answered and informed consent was clearly obtained.      This note may have been partially generated using MModal Fluency Direct system, and there may be some incorrect words, spellings, and punctuation that were not noted in checking the note before saving, though effort was made to avoid such errors.    Clide Dales MD FACS RVT  [facility] -General Surgery

## 2023-05-23 ENCOUNTER — Ambulatory Visit (HOSPITAL_COMMUNITY): Admitting: Certified Registered"

## 2023-05-23 ENCOUNTER — Ambulatory Visit (HOSPITAL_COMMUNITY): Admitting: Surgery

## 2023-05-23 ENCOUNTER — Encounter (HOSPITAL_COMMUNITY)
Admission: RE | Disposition: A | Discharge status: Home / Self Care | Payer: Self-pay | Source: Ambulatory Visit | Attending: Surgery

## 2023-05-23 ENCOUNTER — Other Ambulatory Visit: Payer: Self-pay

## 2023-05-23 ENCOUNTER — Ambulatory Visit
Admission: RE | Admit: 2023-05-23 | Discharge: 2023-05-23 | Disposition: A | Discharge status: Home / Self Care | Payer: 59 | Source: Ambulatory Visit | Attending: Surgery | Admitting: Surgery

## 2023-05-23 ENCOUNTER — Encounter (HOSPITAL_COMMUNITY): Payer: Self-pay | Admitting: Surgery

## 2023-05-23 DIAGNOSIS — Z79899 Other long term (current) drug therapy: Secondary | ICD-10-CM | POA: Insufficient documentation

## 2023-05-23 DIAGNOSIS — E785 Hyperlipidemia, unspecified: Secondary | ICD-10-CM | POA: Insufficient documentation

## 2023-05-23 DIAGNOSIS — Z800 Family history of malignant neoplasm of digestive organs: Secondary | ICD-10-CM

## 2023-05-23 DIAGNOSIS — R195 Other fecal abnormalities: Secondary | ICD-10-CM | POA: Insufficient documentation

## 2023-05-23 DIAGNOSIS — I2510 Atherosclerotic heart disease of native coronary artery without angina pectoris: Secondary | ICD-10-CM | POA: Insufficient documentation

## 2023-05-23 DIAGNOSIS — R197 Diarrhea, unspecified: Secondary | ICD-10-CM | POA: Insufficient documentation

## 2023-05-23 DIAGNOSIS — I10 Essential (primary) hypertension: Secondary | ICD-10-CM | POA: Insufficient documentation

## 2023-05-23 DIAGNOSIS — Z87891 Personal history of nicotine dependence: Secondary | ICD-10-CM | POA: Insufficient documentation

## 2023-05-23 DIAGNOSIS — K5730 Diverticulosis of large intestine without perforation or abscess without bleeding: Secondary | ICD-10-CM | POA: Insufficient documentation

## 2023-05-23 DIAGNOSIS — K52832 Lymphocytic colitis: Secondary | ICD-10-CM | POA: Insufficient documentation

## 2023-05-23 DIAGNOSIS — K648 Other hemorrhoids: Secondary | ICD-10-CM | POA: Insufficient documentation

## 2023-05-23 SURGERY — COLONOSCOPY
Anesthesia: General | Wound class: Clean Contaminated Wounds-The respiratory, GI, Genital, or urinary

## 2023-05-23 MED ORDER — LACTATED RINGERS INTRAVENOUS SOLUTION
INTRAVENOUS | Status: DC | PRN
Start: 2023-05-23 — End: 2023-05-23

## 2023-05-23 MED ORDER — PROPOFOL 10 MG/ML INTRAVENOUS EMULSION
INTRAVENOUS | Status: AC
Start: 2023-05-23 — End: 2023-05-23
  Filled 2023-05-23: qty 20

## 2023-05-23 MED ORDER — PROPOFOL 10 MG/ML INTRAVENOUS EMULSION
Freq: Once | INTRAVENOUS | Status: DC | PRN
Start: 2023-05-23 — End: 2023-05-23
  Administered 2023-05-23: 26 mL via INTRAVENOUS

## 2023-05-23 SURGICAL SUPPLY — 2 items
CLEANER INSTRUMENT PRE-KLENZ 13.5 OZ (MISCELLANEOUS PT CARE ITEMS) ×1 IMPLANT
FORCEPS BIOPSY MICROMESH TTH STREAMLINE CATH NEEDLE 240CM 2.4MM RJ 4 SS LRG CPC STRL DISP ORNG 2.8MM (ENDOSCOPIC SUPPLIES) ×1 IMPLANT

## 2023-05-23 NOTE — Anesthesia Postprocedure Evaluation (Signed)
 Anesthesia Post Op Evaluation    Patient: Raymond Myers  Procedure(s):  COLONOSCOPY WITH RANDOM COLONIC BIOPSIES X6    Last Vitals:Temperature: 36.9 C (98.5 F) (05/23/23 1357)  Heart Rate: 62 (05/23/23 1357)  BP (Non-Invasive): 139/60 (05/23/23 1357)  Respiratory Rate: 16 (05/23/23 1357)  SpO2: 94 % (05/23/23 1357)    No notable events documented.    Patient is sufficiently recovered from the effects of anesthesia to participate in the evaluation and has returned to their pre-procedure level.  Patient location during evaluation: PACU       Patient participation: complete - patient participated  Level of consciousness: awake and alert and responsive to verbal stimuli    Pain score: 0  Pain management: adequate  Airway patency: patent    Anesthetic complications: no  Cardiovascular status: acceptable  Respiratory status: acceptable  Hydration status: acceptable  Patient post-procedure temperature: Pt Normothermic   PONV Status: Absent

## 2023-05-23 NOTE — Discharge Instructions (Addendum)
 SURGICAL DISCHARGE INSTRUCTIONS     Dr. Abbe Amsterdam, Ladona Horns, MD  performed your COLONOSCOPY WITH RANDOM COLONIC BIOPSIES X6 today at the [facility]    [facility]:  Monday through Friday from 8 a.m. - 4 p.m.: [phone]    For T&D: [phone]  Between 4 p.m. - 8 a.m., weekends and holidays:  Call ER [phone]    PLEASE SEE WRITTEN HANDOUTS AS DISCUSSED BY YOUR NURSE:  diverticulosis, Hemorrhoids    ANESTHESIA INFORMATION   ANESTHESIA -- ADULT PATIENTS:  You have received intravenous sedation / general anesthesia, and you may feel drowsy and light-headed for several hours. You may even experience some forgetfulness of the procedure. DO NOT DRIVE A MOTOR VEHICLE or perform any activity requiring complete alertness or coordination until you feel fully awake in about 24-48 hours. Do not drink alcoholic beverages for at least 24 hours. Do not stay alone, you must have a responsible adult available to be with you. You may also experience a dry mouth or nausea for 24 hours. This is a normal side effect and will disappear as the effects of the medication wear off.    REMEMBER   If you experience any difficulty breathing, chest pain, bleeding that you feel is excessive, persistent nausea or vomiting or for any other concerns:  Call your physician Dr.  Abbe Amsterdam, Ladona Horns, MD   at [phone] . You may also ask to have the general doctor on call paged. They are available to you 24 hours a day.      SPECIAL INSTRUCTIONS / COMMENTS   Findings today:  Diverticulosis  Hemorrhoids   Random colon biopsies were done. Dr. Abbe Amsterdam will call you with results next week. If he does not do so, please call the office.    FOLLOW-UP APPOINTMENTS   Please call your surgeon's office at the number listed to schedule a date / time of return for follow-up as needed.    Dr Whitney Muse [phone]

## 2023-05-23 NOTE — OR Surgeon (Signed)
 OPERATIVE NOTE    Patient Name: Raymond Myers, Raymond Myers MRN:: [id number]  Date of Birth: 08/01/1945  Date of Service: 05/23/2023     Pre-Operative Diagnosis: Positive Fecal Hemoccult  Diarrhea  Change in Bowel Habits     Post-Operative Diagnosis: Diverticulosis  Hemorrhoids    Procedure(s)/Description:  COLONOSCOPY WITH RANDOM COLONIC BIOPSIES X6: 86578 (CPT)     Attending Surgeon: Clide Dales, MD     Anesthesia Staff:  CRNA: Welton Flakes, CRNA    Anesthesia Type: .General     Estimated Blood Loss:  minimal    Specimens Removed:   ID Type Source Tests Collected by Time Destination   1 : Random Colonic Biopsies x6 Tissue Colon SURGICAL PATHOLOGY SPECIMEN Clide Dales, MD 05/23/2023 1350       Order Name Source Comment Collection Info Order Time   SURGICAL PATHOLOGY SPECIMEN Colon Pre-op diagnosis:  Positive Fecal Hemoccult  Diarrhea  Change in Bowel Habits Collected By: Clide Dales, MD 05/23/2023  1:52 PM     Release to patient   Automated             Complications:  None immediate    Indications for procedure:    [name], [name] is a 78 y.o. male who presents for  colonoscopy . Risks, benefits, indications, and complications of procedure were discussed in great detail and informed signed consent obtained.          Intraoperative Findings:     The Olympus colonoscope was brought to the operating field gently inserted into the patient's rectum and advanced to the level of the cecum under direct visualization without difficulty.  Once this anatomic landmark was reached the scope was slowly retracted with circumferential visualization of all colonic and rectal walls with findings of internal hemorrhoids with sigmoid diverticulosis.  Multiple random colonic biopsies were accomplished with history of diarrhea and change in bowel habits..  There was no evidence of colonic or rectal polyps, tumors or AV malformations.   The colonic and rectal mucosa revealed no significant abnormalities where  visualized.  Retroflexion of the scope revealed the presence of internal hemorrhoids. The prep was overall adequate for diagnosstic exam however very small abnormalities may be missed given the nature of the exam.          Description of Procedure           The patient was brought to the Operating Suite and placed in the supine position on the operating table.  Anesthesia/nursing personnel provided IV access as well as hemodynamic monitoring.  After appropriate lines and leads were placed, the patient was placed in the left lateral decubitus position where they received total IV anesthesia.   After the patient was deemed comfortable, the Olympus colonoscope was brought to the operative field, gently inserted into the patient's rectum, and advanced to the level of the cecum under direct visualization with identification of the cecum through the use of normal anatomic landmarks.  Once the cecum was clearly identified, the scope was slowly retracted with circumferential visualization of all colonic and rectal walls with findings dictated above. In the rectum  the scope was retroflexed to evaluate the anorectal junction for the presence of hemorrhoids.  The scope was slowly straightened, the colon decompressed of as much air as possible and removed without difficulty.  The patient was returned to the post anesthesia care unit in stable condition having tolerated the procedure well.  Disposition:     Further recommendations based on the results of the biopsies    Clide Dales MD FACS RVT  [facility] -General Surgery

## 2023-05-23 NOTE — H&P (Signed)
 General Surgery  History and Physical    Date of Service:    05/23/2023  Date of Admission:  05/23/2023  Date of Birth:  [birth year]  PCP: Cletus Gash    Reason for admission:  Colonoscopy    HPI:  Raymond Myers is a 78 y.o. White male presenting for colonoscopy for family history of colon cancer with a change in bowel habits with diarrhea  Past Medical History:   Diagnosis Date    Hypercholesterolemia     Hypertension       Past Surgical History:   Procedure Laterality Date    BURR HOLE FOR SUBDURAL HEMATOMA  03/2022    HX APPENDECTOMY      HX CARPAL TUNNEL RELEASE Bilateral     HX HIP REPLACEMENT        Social History     Tobacco Use    Smoking status: Former     Types: Cigarettes    Smokeless tobacco: Current     Types: Chew   Substance Use Topics    Alcohol use: Yes    Drug use: Never       Family Medical History:       Problem Relation (Age of Onset)    Heart Attack Father           Medications Prior to Admission       Prescriptions    allopurinoL (ZYLOPRIM) 300 mg Oral Tablet    Take 1 Tablet (300 mg total) by mouth Once a day    amLODIPine (NORVASC) 10 mg Oral Tablet    Take 1 Tablet (10 mg total) by mouth Once a day    atorvastatin (LIPITOR) 40 mg Oral Tablet    Take 0.5 Tablets (20 mg total) by mouth Every evening Indications: hardening of the arteries due to plaque buildup    cholecalciferol, vitamin D3, 25 mcg (1,000 unit) Oral Tablet    Take 1 Tablet (1,000 Units total) by mouth Once a day    donepeziL (ARICEPT) 10 mg Oral Tablet    Take 2 Tablets (20 mg total) by mouth Every night    doxazosin (CARDURA) 8 mg Oral Tablet    Take 1 Tablet (8 mg total) by mouth Once a day    DULoxetine (CYMBALTA DR) 60 mg Oral Capsule, Delayed Release(E.C.)    Take 1 Capsule (60 mg total) by mouth Once a day    famotidine (PEPCID) 40 mg Oral Tablet    Take 1 Tablet (40 mg total) by mouth Twice daily    loratadine (CLARITIN) 10 mg Oral Tablet    Take 1 Tablet (10 mg total) by mouth Once a day    metoprolol  succinate (TOPROL-XL) 25 mg Oral Tablet Sustained Release 24 hr    Take 1 Tablet (25 mg total) by mouth Once a day    multivitamin with folic acid (THERA) 400 mcg Oral Tablet    Take 1 Tablet by mouth Once a day    NETTLE LEAF ORAL    Take 2 Capsules by mouth Twice daily    potassium chloride (K-DUR) 20 mEq Oral Tab Sust.Rel. Particle/Crystal    Take 1 Tablet (20 mEq total) by mouth    terbinafine HCL (LAMISIL AT) 1 % Cream    Apply topically           @ALLERGY    The above documented section regarding past medical, past surgical, family, and social history (PMFSH) has been reviewed and considered and to the best  of my knowledge represents a valid and accurate reflection of the patient's previous pertinent experiences documented by multiple providers and participants of the EMR.I cannot attest to all entries but do no recognize any gross inaccuracies as the data is a common field across all providers  Further history pertinent to the current encounter will be found as referenced    Physical Exam:    Patient Vitals for the past 24 hrs:   Height Weight   05/23/23 0805 1.829 m (6') 95.3 kg (210 lb)          General: appropriate for age. in no acute distress.    HEENT: Atraumatic, Normocephalic.    Lungs: Nonlabored breathing with symmetric expansion    Heart:Regular wth respect to rate and rythmn.    Abdomen:Soft. Nontender. Nondistended     Psychiatric: Alert and oriented to person, place, and time. affect appropriate    Laboratory Data:         Imaging Studies:    Admission and follow-up radiographic findings were reviewed and noted by me at time of evaluation with appropriate decisions made regarding pertinent abnormalities as pertaining to the surgical evaluation and can be found in the patient's electronic medical record    Assessment:     Change in bowel habits   Diarrhea   Family history of colon cancer    Plan:    Discussed indications, risks and benefits of colonoscopy with the patient.  Discussed the  possibility of polypectomy, biopsies, and possible repeat examinations.  Risks include bleeding, sedation risks, possibility of missed diagnosis of polyp or malignancy, and remote possibilities of perforation and death.  All questions were answered and informed consent was clearly obtained.    This note was partially created using voice recognition software and is inherently subject to errors including those of syntax and "sound alike " substitutions which may escape proof reading. In such instances, original meaning may be extrapolated by contextual derivation.    Clide Dales MD FACS RVT  [facility] -General Surgery

## 2023-05-23 NOTE — Anesthesia Preprocedure Evaluation (Addendum)
 ANESTHESIA PRE-OP EVALUATION  Planned Procedure: COLONOSCOPY WITH BIOPSY  Review of Systems     anesthesia history negative               Pulmonary   past history of smoking ,   Cardiovascular    Hypertension, CAD, ECG reviewed, Heart cath 06/2022 reviewed; denies recent chest pain and hyperlipidemia ,No peripheral edema,  Exercise Tolerance: > or = 4 METS   ,beta blocker therapy  ,taken in last 24 hours     GI/Hepatic/Renal   negative GI/hepatic/renal ROS,         Endo/Other         Neuro/Psych/MS    Subdural hematoma Jan 2024     Cancer    negative hematology/oncology ROS,               Physical Assessment      Airway       Mallampati: III    TM distance: >3 FB    Neck ROM: full  Mouth Opening: good.  Facial hair          Dental       Dentition intact             Pulmonary    Breath sounds clear to auscultation  (-) no rhonchi, no decreased breath sounds, no wheezes, no rales and no stridor     Cardiovascular    Rhythm: regular  Rate: Normal  (-) no friction rub, carotid bruit is not present, no peripheral edema and no murmur     Other findings          Plan  ASA 3     Planned anesthesia type: general     total intravenous anesthesia                          Anesthetic plan and risks discussed with patient  signed consent obtained          Patient's NPO status is appropriate for Anesthesia.

## 2023-05-24 ENCOUNTER — Telehealth (INDEPENDENT_AMBULATORY_CARE_PROVIDER_SITE_OTHER): Payer: Self-pay | Admitting: Surgery

## 2023-05-24 DIAGNOSIS — K52832 Lymphocytic colitis: Secondary | ICD-10-CM

## 2023-05-24 LAB — SURGICAL PATHOLOGY SPECIMEN

## 2023-05-30 ENCOUNTER — Ambulatory Visit (HOSPITAL_COMMUNITY): Payer: Self-pay | Admitting: Surgery

## 2023-05-31 NOTE — Result Encounter Note (Signed)
 Patient to return call and schedule appt .Ma Rings, LPN  1/61/0960 45:40

## 2023-06-08 ENCOUNTER — Ambulatory Visit (INDEPENDENT_AMBULATORY_CARE_PROVIDER_SITE_OTHER): Payer: Self-pay | Admitting: Surgery

## 2023-06-08 ENCOUNTER — Encounter (INDEPENDENT_AMBULATORY_CARE_PROVIDER_SITE_OTHER): Payer: Self-pay | Admitting: Surgery

## 2023-06-08 ENCOUNTER — Other Ambulatory Visit: Payer: Self-pay

## 2023-06-08 VITALS — BP 156/72 | HR 53 | Temp 96.8°F | Resp 18 | Ht 73.0 in | Wt 215.0 lb

## 2023-06-08 DIAGNOSIS — K52832 Lymphocytic colitis: Secondary | ICD-10-CM

## 2023-06-08 DIAGNOSIS — R197 Diarrhea, unspecified: Secondary | ICD-10-CM

## 2023-06-10 ENCOUNTER — Encounter (INDEPENDENT_AMBULATORY_CARE_PROVIDER_SITE_OTHER): Payer: Self-pay | Admitting: Surgery

## 2023-06-10 NOTE — Progress Notes (Signed)
 Office Post-op visit      Reason for Visit: Post Op (Colon results)    History of Present Illness  Raymond Myers presents  for follow- up status post colonoscopy with random colonic biopsies.  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  Feels that it was secondary of nettle leaves was a supplementary was using.  No other complaints    I have reviewed the patient's provided medical records and diagnostic testing including laboratory values, imaging results, documented encounters and providers notes with all pertinent information noted with respect to today's evaluation serving as unique tests and sources as a component of the medical decision making process for this encounter relevant to the patients independent evaluation by me today.        Patient Data  Patient History  Past Medical History:   Diagnosis Date    Hypercholesterolemia     Hypertension          Past Surgical History:   Procedure Laterality Date    BURR HOLE FOR SUBDURAL HEMATOMA  03/2022    HX APPENDECTOMY      HX CARPAL TUNNEL RELEASE Bilateral     HX HIP REPLACEMENT           Family Medical History:       Problem Relation (Age of Onset)    Heart Attack Father            Social History     Tobacco Use    Smoking status: Former     Types: Cigarettes    Smokeless tobacco: Current     Types: Chew   Substance Use Topics    Alcohol use: Yes    Drug use: Never        The above documented section regarding past medical, past surgical, family, and social history (PMFSH) has been reviewed and considered and to the best of my knowledge represents a valid and accurate reflection of the patient's previous pertinent experiences documented by multiple providers and participants of the EMR.I cannot attest to all entries but do no recognize any gross inaccuracies as the data is a common field across all providers  Further history pertinent to the current encounter will be found as referenced       Physical Examination:    Vitals:     06/08/23 1418   BP: (!) 156/72   Pulse: 53   Resp: 18   Temp: 36 C (96.8 F)   SpO2: 94%   Weight: 97.5 kg (215 lb)   Height: 1.854 m (6\' 1" )   BMI: 28.37        No significant abdominal tenderness          Diagnosis:    ICD-10-CM    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 Diarrhea  R19.7               Plan:  Discussed pathology results in detail.  Offered consideration of Entocort taper.  Feels like the diarrhea is resolving with stopping of the supplement.  He would like to avoid and new medication for now.  If symptoms worsen we will reconsider.  All questions answered.  He is agreeable to the plan of care.    This note may have been partially generated using MModal Fluency Direct system, and there may be some incorrect words, spellings, and punctuation that were not noted in checking the note before saving, though effort was made to avoid such errors.  Clem Currier MD FACS RVT  [facility] -General Surgery

## 2023-06-20 ENCOUNTER — Telehealth (INDEPENDENT_AMBULATORY_CARE_PROVIDER_SITE_OTHER): Payer: Self-pay | Admitting: Surgery

## 2023-06-20 NOTE — Telephone Encounter (Signed)
 Patients wife called stating pts diarrhea has come back. Requesting entocort taper. Please advise. Manya Sells, LPN  03/26/1094 04:54

## 2023-06-21 ENCOUNTER — Other Ambulatory Visit (INDEPENDENT_AMBULATORY_CARE_PROVIDER_SITE_OTHER): Payer: Self-pay | Admitting: Surgery

## 2023-06-21 MED ORDER — BUDESONIDE DR - ER 3 MG CAPSULE,DELAYED,EXTENDED RELEASE
DELAYED_RELEASE_CAPSULE | ORAL | 0 refills | Status: AC
Start: 2023-06-21 — End: 2023-08-18

## 2023-06-23 NOTE — Telephone Encounter (Signed)
 Patient aware. Manya Sells, LPN  07/23/979 19:14

## 2023-08-23 DIAGNOSIS — T18128A Food in esophagus causing other injury, initial encounter: Secondary | ICD-10-CM | POA: Insufficient documentation

## 2023-09-21 DIAGNOSIS — K317 Polyp of stomach and duodenum: Secondary | ICD-10-CM | POA: Insufficient documentation

## 2023-09-21 DIAGNOSIS — F17210 Nicotine dependence, cigarettes, uncomplicated: Secondary | ICD-10-CM | POA: Insufficient documentation

## 2023-09-21 DIAGNOSIS — K219 Gastro-esophageal reflux disease without esophagitis: Secondary | ICD-10-CM | POA: Insufficient documentation

## 2023-09-21 DIAGNOSIS — K222 Esophageal obstruction: Secondary | ICD-10-CM | POA: Insufficient documentation

## 2023-12-19 ENCOUNTER — Ambulatory Visit: Payer: Self-pay | Attending: OTOLARYNGOLOGY | Admitting: OTOLARYNGOLOGY

## 2023-12-19 ENCOUNTER — Encounter (INDEPENDENT_AMBULATORY_CARE_PROVIDER_SITE_OTHER): Payer: Self-pay | Admitting: OTOLARYNGOLOGY

## 2023-12-19 ENCOUNTER — Other Ambulatory Visit: Payer: Self-pay

## 2023-12-19 VITALS — Ht 73.0 in | Wt 215.0 lb

## 2023-12-19 DIAGNOSIS — L578 Other skin changes due to chronic exposure to nonionizing radiation: Secondary | ICD-10-CM | POA: Insufficient documentation

## 2023-12-19 DIAGNOSIS — C44329 Squamous cell carcinoma of skin of other parts of face: Secondary | ICD-10-CM | POA: Insufficient documentation

## 2023-12-19 DIAGNOSIS — J3089 Other allergic rhinitis: Secondary | ICD-10-CM | POA: Insufficient documentation

## 2023-12-19 NOTE — H&P (Signed)
 ENT, [facility]  [address]  [place] [address]  Operated by [facility]      Name: Raymond Myers MRN:  [id number]   Date: 12/19/2023 DOB: July 07, 1945 (78 y.o.)       Referring Provider:  Ezra Vicenta JONETTA PONCE, PA    Reason for Visit:   Chief Complaint   Patient presents with    New Patient     New MOHS patient /SCC of central malar cheek         History of Present Illness:  Raymond Myers is a 78 y.o. male who referred from Dermatology for squamous cell carcinoma left cheek.  Endorses moderate sun exposure.  No history of previous skin cancer                Patient History:  Problem List[1]  Current Outpatient Medications   Medication Sig    allopurinoL (ZYLOPRIM) 300 mg Oral Tablet Take 1 Tablet (300 mg total) by mouth Once a day    amLODIPine (NORVASC) 10 mg Oral Tablet Take 1 Tablet (10 mg total) by mouth Once a day    atorvastatin  (LIPITOR) 40 mg Oral Tablet Take 0.5 Tablets (20 mg total) by mouth Every evening Indications: hardening of the arteries due to plaque buildup    cholecalciferol, vitamin D3, 25 mcg (1,000 unit) Oral Tablet Take 1 Tablet (1,000 Units total) by mouth Once a day    donepeziL (ARICEPT) 10 mg Oral Tablet Take 2 Tablets (20 mg total) by mouth Every night    doxazosin (CARDURA) 8 mg Oral Tablet Take 1 Tablet (8 mg total) by mouth Once a day    DULoxetine (CYMBALTA DR) 60 mg Oral Capsule, Delayed Release(E.C.) Take 1 Capsule (60 mg total) by mouth Once a day    famotidine (PEPCID) 40 mg Oral Tablet Take 1 Tablet (40 mg total) by mouth Twice daily    loratadine (CLARITIN) 10 mg Oral Tablet Take 1 Tablet (10 mg total) by mouth Once a day    metoprolol succinate (TOPROL-XL) 25 mg Oral Tablet Sustained Release 24 hr Take 1 Tablet (25 mg total) by mouth Once a day    multivitamin with folic acid (THERA) 400 mcg Oral Tablet Take 1 Tablet by mouth Once a day    NETTLE LEAF ORAL Take 2 Capsules by mouth Twice daily    potassium chloride (K-DUR) 20 mEq Oral Tab  Sust.Rel. Particle/Crystal Take 1 Tablet (20 mEq total) by mouth    terbinafine HCL (LAMISIL AT) 1 % Cream Apply topically      Allergies[2]  Past Medical History:   Diagnosis Date    Hypercholesterolemia     Hypertension      Past Surgical History:   Procedure Laterality Date    BURR HOLE FOR SUBDURAL HEMATOMA  03/2022    HX APPENDECTOMY      HX CARPAL TUNNEL RELEASE Bilateral     HX HIP REPLACEMENT       Family Medical History:       Problem Relation (Age of Onset)    Heart Attack Father            Social History[3]    Review of Systems:  Review of Systems    Physical Exam:  Ht 1.854 m (6' 1)   Wt 97.5 kg (215 lb)   BMI 28.37 kg/m       Physical Exam  Constitutional:       Appearance: Normal appearance. He is well-developed, well-groomed  and normal weight.   HENT:      Head: Normocephalic and atraumatic.      Right Ear: Hearing, tympanic membrane, ear canal and external ear normal.      Left Ear: Hearing, tympanic membrane, ear canal and external ear normal.      Nose: Septal deviation and mucosal edema present.      Right Turbinates: Enlarged.      Left Turbinates: Enlarged.      Mouth/Throat:      Lips: Pink.      Mouth: Mucous membranes are moist.      Pharynx: Oropharynx is clear. Uvula midline.      Comments: Left cheek biopsy site noted  Eyes:      Extraocular Movements: Extraocular movements intact.   Neck:      Trachea: Phonation normal.   Pulmonary:      Effort: Pulmonary effort is normal.   Musculoskeletal:      Cervical back: Normal range of motion and neck supple.   Lymphadenopathy:      Cervical: No cervical adenopathy.   Skin:     General: Skin is warm.   Neurological:      Mental Status: He is alert and oriented to person, place, and time.      Cranial Nerves: Cranial nerves 2-12 are intact. No facial asymmetry.   Psychiatric:         Attention and Perception: Attention normal.         Mood and Affect: Mood normal.         Speech: Speech normal.         Behavior: Behavior normal. Behavior is  cooperative.          Assessment:  ENCOUNTER DIAGNOSES     ICD-10-CM   1. 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 Non-seasonal allergic rhinitis due to other allergic trigger  J30.89       Plan:  -pathology, dermatology note and Medical records reviewed on 12/19/2023.  -Patient is scheduled for Mohs excision with Dermatology  -Mohs reconstruction is indicated post Mohs excision.  Reconstructive options discussed including flap closure possible skin graft.  Risks, benefits, alternatives discussed at length.  All questions answered.  Patient wishes to proceed.  -Check preoperative labs  -Medical clearance  -Nasal saline b.i.d.  -Sunscreen use and sun avoidance encouraged  -Return to clinic post Mohs excision for defect evaluation and repair planning  No orders of the defined types were placed in this encounter.        [name], DO, MMS  ENT / Facial Plastic Surgery    I appreciate the opportunity to be involved in the care of your patients.  If you have any questions or concerns regarding this encounter, please do not hesitate to contact me at your convenience.        This note may have been partially generated using MModal Fluency Direct system, and there may be some incorrect words, spellings, and punctuation that were not noted in checking the note before saving, though effort was made to avoid such errors.         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   [2]   Allergies  Allergen Reactions    Indomethacin Anaphylaxis     Other Reaction(s): Not available  Tongue, Face, lips swollen, no shortness of breath    Lisinopril Swelling    Sulfa (Sulfonamides) Rash   [3]   Social History  Tobacco Use    Smoking status: Former     Types: Cigarettes    Smokeless tobacco: Current     Types: Chew   Substance Use Topics    Alcohol use: Yes    Drug use: Never

## 2023-12-20 ENCOUNTER — Other Ambulatory Visit (INDEPENDENT_AMBULATORY_CARE_PROVIDER_SITE_OTHER): Payer: Self-pay | Admitting: OTOLARYNGOLOGY

## 2023-12-20 DIAGNOSIS — C44329 Squamous cell carcinoma of skin of other parts of face: Secondary | ICD-10-CM

## 2024-01-08 ENCOUNTER — Other Ambulatory Visit (INDEPENDENT_AMBULATORY_CARE_PROVIDER_SITE_OTHER): Payer: Self-pay | Admitting: OTOLARYNGOLOGY

## 2024-01-08 ENCOUNTER — Other Ambulatory Visit: Payer: Self-pay

## 2024-01-08 ENCOUNTER — Ambulatory Visit: Attending: OTOLARYNGOLOGY

## 2024-01-08 ENCOUNTER — Ambulatory Visit (HOSPITAL_BASED_OUTPATIENT_CLINIC_OR_DEPARTMENT_OTHER)
Admission: RE | Admit: 2024-01-08 | Discharge: 2024-01-08 | Disposition: A | Discharge status: Home / Self Care | Source: Ambulatory Visit | Attending: OTOLARYNGOLOGY | Admitting: OTOLARYNGOLOGY

## 2024-01-08 ENCOUNTER — Encounter (INDEPENDENT_AMBULATORY_CARE_PROVIDER_SITE_OTHER): Payer: Self-pay

## 2024-01-08 DIAGNOSIS — C44329 Squamous cell carcinoma of skin of other parts of face: Secondary | ICD-10-CM | POA: Insufficient documentation

## 2024-01-08 DIAGNOSIS — R0602 Shortness of breath: Secondary | ICD-10-CM | POA: Insufficient documentation

## 2024-01-08 DIAGNOSIS — I609 Nontraumatic subarachnoid hemorrhage, unspecified: Secondary | ICD-10-CM | POA: Insufficient documentation

## 2024-01-08 DIAGNOSIS — Z860100 Personal history of colon polyps, unspecified: Secondary | ICD-10-CM | POA: Insufficient documentation

## 2024-01-08 DIAGNOSIS — R6889 Other general symptoms and signs: Secondary | ICD-10-CM | POA: Insufficient documentation

## 2024-01-08 DIAGNOSIS — M545 Low back pain, unspecified: Secondary | ICD-10-CM | POA: Insufficient documentation

## 2024-01-08 DIAGNOSIS — T783XXA Angioneurotic edema, initial encounter: Secondary | ICD-10-CM | POA: Insufficient documentation

## 2024-01-08 DIAGNOSIS — E039 Hypothyroidism, unspecified: Secondary | ICD-10-CM | POA: Insufficient documentation

## 2024-01-08 DIAGNOSIS — R7301 Impaired fasting glucose: Secondary | ICD-10-CM | POA: Insufficient documentation

## 2024-01-08 DIAGNOSIS — H6120 Impacted cerumen, unspecified ear: Secondary | ICD-10-CM | POA: Insufficient documentation

## 2024-01-08 DIAGNOSIS — G894 Chronic pain syndrome: Secondary | ICD-10-CM | POA: Insufficient documentation

## 2024-01-08 DIAGNOSIS — R197 Diarrhea, unspecified: Secondary | ICD-10-CM | POA: Insufficient documentation

## 2024-01-08 DIAGNOSIS — G629 Polyneuropathy, unspecified: Secondary | ICD-10-CM | POA: Insufficient documentation

## 2024-01-08 DIAGNOSIS — M51369 Other intervertebral disc degeneration, lumbar region without mention of lumbar back pain or lower extremity pain: Secondary | ICD-10-CM | POA: Insufficient documentation

## 2024-01-08 DIAGNOSIS — J302 Other seasonal allergic rhinitis: Secondary | ICD-10-CM | POA: Insufficient documentation

## 2024-01-08 DIAGNOSIS — R001 Bradycardia, unspecified: Secondary | ICD-10-CM | POA: Insufficient documentation

## 2024-01-08 DIAGNOSIS — E559 Vitamin D deficiency, unspecified: Secondary | ICD-10-CM | POA: Insufficient documentation

## 2024-01-08 DIAGNOSIS — I119 Hypertensive heart disease without heart failure: Secondary | ICD-10-CM | POA: Insufficient documentation

## 2024-01-08 DIAGNOSIS — Z720 Tobacco use: Secondary | ICD-10-CM | POA: Insufficient documentation

## 2024-01-08 DIAGNOSIS — Z7729 Contact with and (suspected ) exposure to other hazardous substances: Secondary | ICD-10-CM | POA: Insufficient documentation

## 2024-01-08 DIAGNOSIS — C4491 Basal cell carcinoma of skin, unspecified: Secondary | ICD-10-CM | POA: Insufficient documentation

## 2024-01-08 DIAGNOSIS — R238 Other skin changes: Secondary | ICD-10-CM | POA: Insufficient documentation

## 2024-01-08 DIAGNOSIS — R809 Proteinuria, unspecified: Secondary | ICD-10-CM | POA: Insufficient documentation

## 2024-01-08 DIAGNOSIS — H9313 Tinnitus, bilateral: Secondary | ICD-10-CM | POA: Insufficient documentation

## 2024-01-08 DIAGNOSIS — E876 Hypokalemia: Secondary | ICD-10-CM | POA: Insufficient documentation

## 2024-01-08 LAB — PT/INR
INR: 1.04 (ref 0.84–1.10)
PROTHROMBIN TIME: 11.8 s (ref 9.8–12.7)

## 2024-01-08 LAB — CBC WITH DIFF
BASOPHIL #: 0.02 x10ˆ3/uL (ref 0.00–0.10)
BASOPHIL %: 0 % (ref 0–1)
EOSINOPHIL #: 0.14 x10ˆ3/uL (ref 0.00–0.60)
EOSINOPHIL %: 2 % (ref 1–8)
HCT: 43 % (ref 36.7–47.1)
HGB: 14.5 g/dL (ref 12.5–16.3)
LYMPHOCYTE #: 1.52 x10ˆ3/uL (ref 1.00–3.00)
LYMPHOCYTE %: 24 % (ref 15–43)
MCH: 31.7 pg (ref 23.8–33.4)
MCHC: 33.6 g/dL (ref 32.5–36.3)
MCV: 94.3 fL (ref 73.0–96.2)
MONOCYTE #: 0.5 x10ˆ3/uL (ref 0.30–1.00)
MONOCYTE %: 8 % (ref 6–14)
MPV: 7.9 fL (ref 7.4–11.4)
NEUTROPHIL #: 4.06 x10ˆ3/uL (ref 1.85–7.84)
NEUTROPHIL %: 65 % (ref 44–74)
PLATELETS: 192 x10ˆ3/uL (ref 140–440)
RBC: 4.56 x10ˆ6/uL (ref 4.06–5.63)
RDW: 15.9 % (ref 12.1–16.2)
WBC: 6.2 x10ˆ3/uL (ref 3.6–10.2)

## 2024-01-08 LAB — BASIC METABOLIC PANEL
ANION GAP: 10 mmol/L (ref 4–13)
BUN/CREA RATIO: 10
BUN: 10 mg/dL (ref 7–18)
CALCIUM: 8.8 mg/dL (ref 8.5–10.1)
CHLORIDE: 105 mmol/L (ref 98–107)
CO2 TOTAL: 24 mmol/L (ref 21–32)
CREATININE: 1.05 mg/dL (ref 0.70–1.30)
ESTIMATED GFR: 72 mL/min/1.73mˆ2 (ref >59)
GLUCOSE: 119 mg/dL — ABNORMAL HIGH (ref 74–106)
OSMOLALITY, CALCULATED: 278 mosm/kg (ref 270–290)
POTASSIUM: 3.8 mmol/L (ref 3.5–5.1)
SODIUM: 139 mmol/L (ref 136–145)

## 2024-01-08 LAB — PTT (PARTIAL THROMBOPLASTIN TIME): APTT: 34.5 s (ref 25.0–38.0)

## 2024-01-08 NOTE — Ancillary Notes (Signed)
 SPOKE WITH APRIL AT DR. LEONARDA OFFICE AND MADE AWARE EKG IS AVAILABLE IN EPIC.

## 2024-01-09 ENCOUNTER — Ambulatory Visit: Payer: Self-pay | Attending: OTOLARYNGOLOGY | Admitting: OTOLARYNGOLOGY

## 2024-01-09 ENCOUNTER — Encounter (INDEPENDENT_AMBULATORY_CARE_PROVIDER_SITE_OTHER): Payer: Self-pay | Admitting: OTOLARYNGOLOGY

## 2024-01-09 VITALS — Ht 73.0 in | Wt 215.0 lb

## 2024-01-09 DIAGNOSIS — L578 Other skin changes due to chronic exposure to nonionizing radiation: Secondary | ICD-10-CM | POA: Insufficient documentation

## 2024-01-09 DIAGNOSIS — I4510 Unspecified right bundle-branch block: Secondary | ICD-10-CM

## 2024-01-09 DIAGNOSIS — I444 Left anterior fascicular block: Secondary | ICD-10-CM

## 2024-01-09 DIAGNOSIS — C44329 Squamous cell carcinoma of skin of other parts of face: Secondary | ICD-10-CM | POA: Insufficient documentation

## 2024-01-09 DIAGNOSIS — I452 Bifascicular block: Secondary | ICD-10-CM

## 2024-01-09 DIAGNOSIS — J3089 Other allergic rhinitis: Secondary | ICD-10-CM | POA: Insufficient documentation

## 2024-01-09 DIAGNOSIS — R9431 Abnormal electrocardiogram [ECG] [EKG]: Secondary | ICD-10-CM

## 2024-01-09 DIAGNOSIS — Z48817 Encounter for surgical aftercare following surgery on the skin and subcutaneous tissue: Secondary | ICD-10-CM

## 2024-01-09 LAB — ECG 12 LEAD
Atrial Rate: 71 {beats}/min
Calculated P Axis: 65 degrees
Calculated R Axis: -77 degrees
Calculated T Axis: 54 degrees
PR Interval: 200 ms
QRS Duration: 144 ms
QT Interval: 468 ms
QTC Calculation: 508 ms
Ventricular rate: 71 {beats}/min

## 2024-01-09 MED ORDER — MUPIROCIN 2 % TOPICAL OINTMENT
TOPICAL_OINTMENT | Freq: Three times a day (TID) | CUTANEOUS | 5 refills | Status: AC
Start: 2024-01-09 — End: ?

## 2024-01-09 MED ORDER — OXYCODONE-ACETAMINOPHEN 5 MG-325 MG TABLET
1.0000 | ORAL_TABLET | ORAL | 0 refills | Status: AC | PRN
Start: 2024-01-09 — End: 2024-02-02

## 2024-01-09 MED ORDER — CEFDINIR 300 MG CAPSULE
300.0000 mg | ORAL_CAPSULE | Freq: Two times a day (BID) | ORAL | 0 refills | Status: AC
Start: 2024-01-09 — End: 2024-02-02

## 2024-01-09 NOTE — H&P (Signed)
 [facility]  [address]  [place] [address]  Operated by [facility]      Name: Raymond Myers MRN:  [id number]   Date: 01/09/2024 DOB: 1945/10/29 (78 y.o.)       Referring Provider:  Ezra Vicenta JONETTA PONCE, PA    Reason for Visit:   Chief Complaint   Patient presents with    Follow Up     MOHS   Left cheek area         History of Present Illness:  Raymond Myers is a 78 y.o. male who presents for follow-up on squamous cell carcinoma left cheek.  Underwent Mohs excision by local dermatologist today.  Endorses moderate sun exposure.  No history of previous skin cancer      Patient History:  Problem List[1]  Current Outpatient Medications   Medication Sig    allopurinoL (ZYLOPRIM) 300 mg Oral Tablet Take 1 Tablet (300 mg total) by mouth Once a day    amLODIPine (NORVASC) 10 mg Oral Tablet Take 1 Tablet (10 mg total) by mouth Once a day    atorvastatin  (LIPITOR) 40 mg Oral Tablet Take 0.5 Tablets (20 mg total) by mouth Every evening Indications: hardening of the arteries due to plaque buildup    cefdinir (OMNICEF) 300 mg Oral Capsule Take 1 Capsule (300 mg total) by mouth Twice daily for 7 days    cholecalciferol, vitamin D3, 25 mcg (1,000 unit) Oral Tablet Take 1 Tablet (1,000 Units total) by mouth Once a day    donepeziL (ARICEPT) 10 mg Oral Tablet Take 2 Tablets (20 mg total) by mouth Every night    doxazosin (CARDURA) 8 mg Oral Tablet Take 1 Tablet (8 mg total) by mouth Once a day    DULoxetine (CYMBALTA DR) 60 mg Oral Capsule, Delayed Release(E.C.) Take 1 Capsule (60 mg total) by mouth Once a day    famotidine (PEPCID) 40 mg Oral Tablet Take 1 Tablet (40 mg total) by mouth Twice daily    loratadine (CLARITIN) 10 mg Oral Tablet Take 1 Tablet (10 mg total) by mouth Once a day    metoprolol succinate (TOPROL-XL) 25 mg Oral Tablet Sustained Release 24 hr Take 1 Tablet (25 mg total) by mouth Once a day    multivitamin with folic acid (THERA) 400 mcg Oral Tablet Take 1 Tablet  by mouth Once a day    mupirocin (BACTROBAN) 2 % Ointment Apply topically Three times a day    NETTLE LEAF ORAL Take 2 Capsules by mouth Twice daily    oxyCODONE-acetaminophen (PERCOCET) 5-325 mg Oral Tablet Take 1 Tablet by mouth Every 4 hours as needed for Pain for up to 4 days    potassium chloride (K-DUR) 20 mEq Oral Tab Sust.Rel. Particle/Crystal Take 1 Tablet (20 mEq total) by mouth    terbinafine HCL (LAMISIL AT) 1 % Cream Apply topically      Allergies[2]  Past Medical History:   Diagnosis Date    Hypercholesterolemia     Hypertension      Past Surgical History:   Procedure Laterality Date    BURR HOLE FOR SUBDURAL HEMATOMA  03/2022    HX APPENDECTOMY      HX CARPAL TUNNEL RELEASE Bilateral     HX HIP REPLACEMENT       Family Medical History:       Problem Relation (Age of Onset)    Heart Attack Father  Social History[3]    Review of Systems:  Review of Systems    Physical Exam:  Ht 1.854 m (6' 1)   Wt 97.5 kg (215 lb)   BMI 28.37 kg/m       Physical Exam  Constitutional:       Appearance: Normal appearance. He is well-developed, well-groomed and normal weight.   HENT:      Head: Normocephalic and atraumatic.      Right Ear: Hearing, tympanic membrane, ear canal and external ear normal.      Left Ear: Hearing, tympanic membrane, ear canal and external ear normal.      Nose: Septal deviation and mucosal edema present.      Right Turbinates: Enlarged.      Left Turbinates: Enlarged.      Mouth/Throat:      Lips: Pink.      Mouth: Mucous membranes are moist.      Pharynx: Oropharynx is clear. Uvula midline.      Comments: Left cheek Mohs defect  Eyes:      Extraocular Movements: Extraocular movements intact.   Neck:      Trachea: Phonation normal.   Pulmonary:      Effort: Pulmonary effort is normal.   Musculoskeletal:      Cervical back: Normal range of motion and neck supple.   Lymphadenopathy:      Cervical: No cervical adenopathy.   Skin:     General: Skin is warm.   Neurological:      Mental  Status: He is alert and oriented to person, place, and time.      Cranial Nerves: Cranial nerves 2-12 are intact. No facial asymmetry.   Psychiatric:         Attention and Perception: Attention normal.         Mood and Affect: Mood normal.         Speech: Speech normal.         Behavior: Behavior normal. Behavior is cooperative.          Assessment:  ENCOUNTER DIAGNOSES     ICD-10-CM   1. 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  -Mohs reconstruction is indicated post Mohs excision.  Reconstructive options discussed including flap closure possible skin graft.  Risks, benefits, alternatives discussed at length.  All questions answered.  Patient wishes to proceed.  -Rx cefdinir  -Rx Percocet PRN pain  -Rx mupirocin ointment  -Nasal saline b.i.d.  -Sunscreen use and sun avoidance encouraged  Orders Placed This Encounter    oxyCODONE-acetaminophen (PERCOCET) 5-325 mg Oral Tablet    mupirocin (BACTROBAN) 2 % Ointment    cefdinir (OMNICEF) 300 mg Oral Capsule         [name], DO, MMS  ENT / Facial Plastic Surgery    I appreciate the opportunity to be involved in the care of your patients.  If you have any questions or concerns regarding this encounter, please do not hesitate to contact me at your convenience.        This note may have been partially generated using MModal Fluency Direct system, and there may be some incorrect words, spellings, and punctuation that were not noted in checking the note before saving, though effort was made to  avoid such errors.           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    Vitamin D deficiency    Tobacco user    Bilateral tinnitus    Traumatic subdural hemorrhage without loss of  consciousness, initial encounter (CMS HCC)    Stricture of esophagus    Skin irritation    Sigmoid diverticulosis    Shortness of breath    Seasonal allergies    Proteinuria    Peripheral neuropathy    No pertinent family history    Hyperlipidemia    Low back pain    Intracranial subarachnoid hemorrhage (CMS HCC)    Impaired fasting glucose    Impacted cerumen, unspecified ear    Hypothyroidism    Gout    Hypertensive heart disease without heart failure    History of colonoscopy    History of colonic polyps    History of actinic keratosis    Gastroesophageal reflux disease without esophagitis    Gastric polyp    Former smoker    Forgetfulness    Exposure to potentially hazardous substance    Hypertension    Food in esophagus causing other injury, initial encounter    Encounter for preventive health examination    Diarrhea, unspecified    Degeneration of intervertebral disc of lumbar region    Cigarette smoker    Allergic urticaria    Chronic pain disorder    Chronic hypopotassemia    Carpal tunnel syndrome of left wrist    Bradycardia    Basal cell carcinoma of skin, unspecified    Angiotensin converting enzyme inhibitor-aggravated angioedema    Allergic rhinitis due to house dust mite    Allergic rhinitis    Actinic keratosis    Rhinophyma    Other rosacea    Neoplasm of uncertain behavior of skin    Does not use smokeless tobacco    Does not use illicit drugs    Current drinker of alcohol   [2]   Allergies  Allergen Reactions    Ace Inhibitors Swelling     Other Reaction(s): Angioedema, Not available, Other, Other (See Comments), Unknown    syncope    Syncope    lisinopril    Product containing angiotensin-converting enzyme inhibitor (product)    Indomethacin Anaphylaxis     Other Reaction(s): Not available    Tongue, Face, lips swollen, no shortness of breath    Lisinopril Swelling    Sulfa (Sulfonamides) Rash     Other Reaction(s): Not available, Other - See Comments, Unknown    Mother stated he was allergic as  a child and he has never taken.    Codeine      Other Reaction(s): Unknown    House Dust Mite      Other Reaction(s): Not available   [3]   Social History  Tobacco Use    Smoking status: Former     Types: Cigarettes    Smokeless tobacco: Current     Types: Chew   Substance Use Topics    Alcohol use: Yes    Drug use: Never

## 2024-01-09 NOTE — H&P (View-Only) (Signed)
 [facility]  [address]  [place] [address]  Operated by [facility]      Name: CHADRIC KIMBERLEY MRN:  [id number]   Date: 01/09/2024 DOB: 1945/10/29 (78 y.o.)       Referring Provider:  Ezra Vicenta JONETTA PONCE, PA    Reason for Visit:   Chief Complaint   Patient presents with    Follow Up     MOHS   Left cheek area         History of Present Illness:  Raymond Myers is a 78 y.o. male who presents for follow-up on squamous cell carcinoma left cheek.  Underwent Mohs excision by local dermatologist today.  Endorses moderate sun exposure.  No history of previous skin cancer      Patient History:  Problem List[1]  Current Outpatient Medications   Medication Sig    allopurinoL (ZYLOPRIM) 300 mg Oral Tablet Take 1 Tablet (300 mg total) by mouth Once a day    amLODIPine (NORVASC) 10 mg Oral Tablet Take 1 Tablet (10 mg total) by mouth Once a day    atorvastatin  (LIPITOR) 40 mg Oral Tablet Take 0.5 Tablets (20 mg total) by mouth Every evening Indications: hardening of the arteries due to plaque buildup    cefdinir (OMNICEF) 300 mg Oral Capsule Take 1 Capsule (300 mg total) by mouth Twice daily for 7 days    cholecalciferol, vitamin D3, 25 mcg (1,000 unit) Oral Tablet Take 1 Tablet (1,000 Units total) by mouth Once a day    donepeziL (ARICEPT) 10 mg Oral Tablet Take 2 Tablets (20 mg total) by mouth Every night    doxazosin (CARDURA) 8 mg Oral Tablet Take 1 Tablet (8 mg total) by mouth Once a day    DULoxetine (CYMBALTA DR) 60 mg Oral Capsule, Delayed Release(E.C.) Take 1 Capsule (60 mg total) by mouth Once a day    famotidine (PEPCID) 40 mg Oral Tablet Take 1 Tablet (40 mg total) by mouth Twice daily    loratadine (CLARITIN) 10 mg Oral Tablet Take 1 Tablet (10 mg total) by mouth Once a day    metoprolol succinate (TOPROL-XL) 25 mg Oral Tablet Sustained Release 24 hr Take 1 Tablet (25 mg total) by mouth Once a day    multivitamin with folic acid (THERA) 400 mcg Oral Tablet Take 1 Tablet  by mouth Once a day    mupirocin (BACTROBAN) 2 % Ointment Apply topically Three times a day    NETTLE LEAF ORAL Take 2 Capsules by mouth Twice daily    oxyCODONE-acetaminophen (PERCOCET) 5-325 mg Oral Tablet Take 1 Tablet by mouth Every 4 hours as needed for Pain for up to 4 days    potassium chloride (K-DUR) 20 mEq Oral Tab Sust.Rel. Particle/Crystal Take 1 Tablet (20 mEq total) by mouth    terbinafine HCL (LAMISIL AT) 1 % Cream Apply topically      Allergies[2]  Past Medical History:   Diagnosis Date    Hypercholesterolemia     Hypertension      Past Surgical History:   Procedure Laterality Date    BURR HOLE FOR SUBDURAL HEMATOMA  03/2022    HX APPENDECTOMY      HX CARPAL TUNNEL RELEASE Bilateral     HX HIP REPLACEMENT       Family Medical History:       Problem Relation (Age of Onset)    Heart Attack Father  Social History[3]    Review of Systems:  Review of Systems    Physical Exam:  Ht 1.854 m (6' 1)   Wt 97.5 kg (215 lb)   BMI 28.37 kg/m       Physical Exam  Constitutional:       Appearance: Normal appearance. He is well-developed, well-groomed and normal weight.   HENT:      Head: Normocephalic and atraumatic.      Right Ear: Hearing, tympanic membrane, ear canal and external ear normal.      Left Ear: Hearing, tympanic membrane, ear canal and external ear normal.      Nose: Septal deviation and mucosal edema present.      Right Turbinates: Enlarged.      Left Turbinates: Enlarged.      Mouth/Throat:      Lips: Pink.      Mouth: Mucous membranes are moist.      Pharynx: Oropharynx is clear. Uvula midline.      Comments: Left cheek Mohs defect  Eyes:      Extraocular Movements: Extraocular movements intact.   Neck:      Trachea: Phonation normal.   Pulmonary:      Effort: Pulmonary effort is normal.   Musculoskeletal:      Cervical back: Normal range of motion and neck supple.   Lymphadenopathy:      Cervical: No cervical adenopathy.   Skin:     General: Skin is warm.   Neurological:      Mental  Status: He is alert and oriented to person, place, and time.      Cranial Nerves: Cranial nerves 2-12 are intact. No facial asymmetry.   Psychiatric:         Attention and Perception: Attention normal.         Mood and Affect: Mood normal.         Speech: Speech normal.         Behavior: Behavior normal. Behavior is cooperative.          Assessment:  ENCOUNTER DIAGNOSES     ICD-10-CM   1. 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  -Mohs reconstruction is indicated post Mohs excision.  Reconstructive options discussed including flap closure possible skin graft.  Risks, benefits, alternatives discussed at length.  All questions answered.  Patient wishes to proceed.  -Rx cefdinir  -Rx Percocet PRN pain  -Rx mupirocin ointment  -Nasal saline b.i.d.  -Sunscreen use and sun avoidance encouraged  Orders Placed This Encounter    oxyCODONE-acetaminophen (PERCOCET) 5-325 mg Oral Tablet    mupirocin (BACTROBAN) 2 % Ointment    cefdinir (OMNICEF) 300 mg Oral Capsule         [name], DO, MMS  ENT / Facial Plastic Surgery    I appreciate the opportunity to be involved in the care of your patients.  If you have any questions or concerns regarding this encounter, please do not hesitate to contact me at your convenience.        This note may have been partially generated using MModal Fluency Direct system, and there may be some incorrect words, spellings, and punctuation that were not noted in checking the note before saving, though effort was made to  avoid such errors.           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    Vitamin D deficiency    Tobacco user    Bilateral tinnitus    Traumatic subdural hemorrhage without loss of  consciousness, initial encounter (CMS HCC)    Stricture of esophagus    Skin irritation    Sigmoid diverticulosis    Shortness of breath    Seasonal allergies    Proteinuria    Peripheral neuropathy    No pertinent family history    Hyperlipidemia    Low back pain    Intracranial subarachnoid hemorrhage (CMS HCC)    Impaired fasting glucose    Impacted cerumen, unspecified ear    Hypothyroidism    Gout    Hypertensive heart disease without heart failure    History of colonoscopy    History of colonic polyps    History of actinic keratosis    Gastroesophageal reflux disease without esophagitis    Gastric polyp    Former smoker    Forgetfulness    Exposure to potentially hazardous substance    Hypertension    Food in esophagus causing other injury, initial encounter    Encounter for preventive health examination    Diarrhea, unspecified    Degeneration of intervertebral disc of lumbar region    Cigarette smoker    Allergic urticaria    Chronic pain disorder    Chronic hypopotassemia    Carpal tunnel syndrome of left wrist    Bradycardia    Basal cell carcinoma of skin, unspecified    Angiotensin converting enzyme inhibitor-aggravated angioedema    Allergic rhinitis due to house dust mite    Allergic rhinitis    Actinic keratosis    Rhinophyma    Other rosacea    Neoplasm of uncertain behavior of skin    Does not use smokeless tobacco    Does not use illicit drugs    Current drinker of alcohol   [2]   Allergies  Allergen Reactions    Ace Inhibitors Swelling     Other Reaction(s): Angioedema, Not available, Other, Other (See Comments), Unknown    syncope    Syncope    lisinopril    Product containing angiotensin-converting enzyme inhibitor (product)    Indomethacin Anaphylaxis     Other Reaction(s): Not available    Tongue, Face, lips swollen, no shortness of breath    Lisinopril Swelling    Sulfa (Sulfonamides) Rash     Other Reaction(s): Not available, Other - See Comments, Unknown    Mother stated he was allergic as  a child and he has never taken.    Codeine      Other Reaction(s): Unknown    House Dust Mite      Other Reaction(s): Not available   [3]   Social History  Tobacco Use    Smoking status: Former     Types: Cigarettes    Smokeless tobacco: Current     Types: Chew   Substance Use Topics    Alcohol use: Yes    Drug use: Never

## 2024-01-09 NOTE — Nursing Note (Signed)
 Contacted pt to make sure it was ok to give him the percocet his wife  stated  yes she will observe him

## 2024-01-10 ENCOUNTER — Encounter (HOSPITAL_COMMUNITY): Payer: Self-pay | Admitting: OTOLARYNGOLOGY

## 2024-01-11 ENCOUNTER — Ambulatory Visit
Admission: RE | Admit: 2024-01-11 | Discharge: 2024-01-11 | Disposition: A | Discharge status: Home / Self Care | Source: Ambulatory Visit | Attending: OTOLARYNGOLOGY | Admitting: OTOLARYNGOLOGY

## 2024-01-11 ENCOUNTER — Ambulatory Visit (HOSPITAL_COMMUNITY): Admitting: Anesthesiology

## 2024-01-11 ENCOUNTER — Encounter (HOSPITAL_COMMUNITY): Payer: Self-pay | Admitting: OTOLARYNGOLOGY

## 2024-01-11 ENCOUNTER — Other Ambulatory Visit: Payer: Self-pay

## 2024-01-11 ENCOUNTER — Encounter (HOSPITAL_COMMUNITY): Payer: Self-pay

## 2024-01-11 DIAGNOSIS — C44329 Squamous cell carcinoma of skin of other parts of face: Secondary | ICD-10-CM | POA: Insufficient documentation

## 2024-01-11 HISTORY — DX: Hyperlipidemia, unspecified: E78.5

## 2024-01-11 HISTORY — DX: Gout, unspecified: M10.9

## 2024-01-11 HISTORY — DX: Personal history of other diseases of the nervous system and sense organs: Z86.69

## 2024-01-11 HISTORY — DX: Polyneuropathy, unspecified: G62.9

## 2024-01-11 HISTORY — DX: Shortness of breath: R06.02

## 2024-01-11 SURGERY — PLACEMENT FLAP LOCAL TO FACIAL DEFECT
Anesthesia: General | Site: Face | Laterality: Left | Wound class: Contaminated Wounds-Open, fresh, accidental wounds

## 2024-01-11 MED ORDER — SODIUM CHLORIDE 0.9 % (FLUSH) INJECTION SYRINGE
3.0000 mL | INJECTION | INTRAMUSCULAR | Status: DC | PRN
Start: 2024-01-11 — End: 2024-01-11

## 2024-01-11 MED ORDER — SODIUM CHLORIDE 0.9 % (FLUSH) INJECTION SYRINGE
3.0000 mL | INJECTION | Freq: Three times a day (TID) | INTRAMUSCULAR | Status: DC
Start: 2024-01-11 — End: 2024-01-11

## 2024-01-11 MED ORDER — SUGAMMADEX 100 MG/ML INTRAVENOUS SOLUTION
Freq: Once | INTRAVENOUS | Status: DC | PRN
Start: 2024-01-11 — End: 2024-01-11
  Administered 2024-01-11: 250 mg via INTRAVENOUS

## 2024-01-11 MED ORDER — LACTATED RINGERS INTRAVENOUS SOLUTION
INTRAVENOUS | Status: DC | PRN
Start: 2024-01-11 — End: 2024-01-11
  Administered 2024-01-11: 0 via INTRAVENOUS

## 2024-01-11 MED ORDER — CEFAZOLIN 2 GRAM INTRAVENOUS SOLUTION
INTRAVENOUS | Status: AC
Start: 2024-01-11 — End: 2024-01-11
  Filled 2024-01-11: qty 14.71

## 2024-01-11 MED ORDER — FENTANYL (PF) 50 MCG/ML INJECTION SOLUTION
INTRAMUSCULAR | Status: AC
Start: 2024-01-11 — End: 2024-01-11
  Filled 2024-01-11: qty 2

## 2024-01-11 MED ORDER — IPRATROPIUM 0.5 MG-ALBUTEROL 3 MG (2.5 MG BASE)/3 ML NEBULIZATION SOLN
3.0000 mL | INHALATION_SOLUTION | Freq: Once | RESPIRATORY_TRACT | Status: DC | PRN
Start: 2024-01-11 — End: 2024-01-11

## 2024-01-11 MED ORDER — ONDANSETRON HCL (PF) 4 MG/2 ML INJECTION SOLUTION
Freq: Once | INTRAMUSCULAR | Status: DC | PRN
Start: 2024-01-11 — End: 2024-01-11
  Administered 2024-01-11: 4 mg via INTRAVENOUS

## 2024-01-11 MED ORDER — LACTATED RINGERS INTRAVENOUS SOLUTION
INTRAVENOUS | Status: DC
Start: 2024-01-11 — End: 2024-01-11

## 2024-01-11 MED ORDER — ONDANSETRON HCL (PF) 4 MG/2 ML INJECTION SOLUTION
4.0000 mg | INTRAMUSCULAR | Status: DC | PRN
Start: 2024-01-11 — End: 2024-01-11

## 2024-01-11 MED ORDER — MUPIROCIN 2 % TOPICAL OINTMENT
TOPICAL_OINTMENT | CUTANEOUS | Status: AC
Start: 2024-01-11 — End: 2024-01-11
  Filled 2024-01-11: qty 22

## 2024-01-11 MED ORDER — FENTANYL (PF) 50 MCG/ML INJECTION WRAPPER
50.0000 ug | INJECTION | INTRAMUSCULAR | Status: DC | PRN
Start: 2024-01-11 — End: 2024-01-11

## 2024-01-11 MED ORDER — CEFAZOLIN 1 GRAM SOLUTION FOR INJECTION
Freq: Once | INTRAMUSCULAR | Status: DC | PRN
Start: 2024-01-11 — End: 2024-01-11
  Administered 2024-01-11: 2000 mg via INTRAVENOUS

## 2024-01-11 MED ORDER — HYDROCODONE 7.5 MG-ACETAMINOPHEN 325 MG/15 ML ORAL SOLUTION
5.0000 mg | Freq: Four times a day (QID) | ORAL | Status: DC | PRN
Start: 2024-01-11 — End: 2024-01-11

## 2024-01-11 MED ORDER — SODIUM CHLORIDE 0.9% FLUSH BAG - 250 ML
INTRAVENOUS | Status: DC | PRN
Start: 2024-01-11 — End: 2024-01-11

## 2024-01-11 MED ORDER — MUPIROCIN 2 % TOPICAL OINTMENT
TOPICAL_OINTMENT | Freq: Once | CUTANEOUS | Status: DC | PRN
Start: 2024-01-11 — End: 2024-01-11
  Administered 2024-01-11: 1 via TOPICAL

## 2024-01-11 MED ORDER — SODIUM CHLORIDE 0.9 % INTRAVENOUS SOLUTION
INTRAVENOUS | Status: AC
Start: 2024-01-11 — End: 2024-01-11
  Filled 2024-01-11: qty 50

## 2024-01-11 MED ORDER — DEXAMETHASONE SODIUM PHOSPHATE 4 MG/ML INJECTION SOLUTION
Freq: Once | INTRAMUSCULAR | Status: DC | PRN
Start: 2024-01-11 — End: 2024-01-11
  Administered 2024-01-11: 4 mg via INTRAVENOUS

## 2024-01-11 MED ORDER — LIDOCAINE (PF) 100 MG/5 ML (2 %) INTRAVENOUS SYRINGE
INJECTION | Freq: Once | INTRAVENOUS | Status: DC | PRN
Start: 2024-01-11 — End: 2024-01-11
  Administered 2024-01-11: 100 mg via INTRAVENOUS

## 2024-01-11 MED ORDER — DEXTROSE 5% IN WATER (D5W) FLUSH BAG - 250 ML
INTRAVENOUS | Status: DC | PRN
Start: 2024-01-11 — End: 2024-01-11

## 2024-01-11 MED ORDER — PROCHLORPERAZINE EDISYLATE 10 MG/2 ML (5 MG/ML) INJECTION SOLUTION
5.0000 mg | Freq: Once | INTRAMUSCULAR | Status: DC | PRN
Start: 2024-01-11 — End: 2024-01-11

## 2024-01-11 MED ORDER — ALBUTEROL SULFATE 2.5 MG/3 ML (0.083 %) SOLUTION FOR NEBULIZATION
2.5000 mg | INHALATION_SOLUTION | Freq: Once | RESPIRATORY_TRACT | Status: DC | PRN
Start: 2024-01-11 — End: 2024-01-11

## 2024-01-11 MED ORDER — LIDOCAINE-EPINEPHRINE (PF) 1 %-1:100,000 INJECTION SOLUTION
Freq: Once | INTRAMUSCULAR | Status: DC | PRN
Start: 2024-01-11 — End: 2024-01-11
  Administered 2024-01-11: 9 mL

## 2024-01-11 MED ORDER — PROPOFOL 10 MG/ML IV BOLUS
INJECTION | Freq: Once | INTRAVENOUS | Status: DC | PRN
Start: 2024-01-11 — End: 2024-01-11
  Administered 2024-01-11: 20 mg via INTRAVENOUS
  Administered 2024-01-11: 180 mg via INTRAVENOUS

## 2024-01-11 MED ORDER — FENTANYL (PF) 50 MCG/ML INJECTION WRAPPER
INJECTION | Freq: Once | INTRAMUSCULAR | Status: DC | PRN
Start: 2024-01-11 — End: 2024-01-11
  Administered 2024-01-11 (×2): 50 ug via INTRAVENOUS

## 2024-01-11 MED ORDER — ONDANSETRON HCL (PF) 4 MG/2 ML INJECTION SOLUTION
4.0000 mg | Freq: Once | INTRAMUSCULAR | Status: DC | PRN
Start: 2024-01-11 — End: 2024-01-11

## 2024-01-11 MED ORDER — FENTANYL (PF) 50 MCG/ML INJECTION WRAPPER
25.0000 ug | INJECTION | INTRAMUSCULAR | Status: DC | PRN
Start: 2024-01-11 — End: 2024-01-11

## 2024-01-11 MED ORDER — ETHYL ALCOHOL 62 % TOPICAL SWAB
1.0000 | Freq: Once | CUTANEOUS | Status: AC
Start: 2024-01-11 — End: 2024-01-11
  Administered 2024-01-11: 1 via NASAL

## 2024-01-11 MED ORDER — ROCURONIUM 10 MG/ML INTRAVENOUS SOLUTION
Freq: Once | INTRAVENOUS | Status: DC | PRN
Start: 2024-01-11 — End: 2024-01-11
  Administered 2024-01-11: 50 mg via INTRAVENOUS

## 2024-01-11 SURGICAL SUPPLY — 19 items
BLADE 15 2 END CBNSTL SURG STRL DISP (SURGICAL CUTTING SUPPLIES) ×2 IMPLANT
CLEANER INSTRUMENT PRE-KLENZ 13.5 OZ (MISCELLANEOUS PT CARE ITEMS) ×1 IMPLANT
CONTAINR CLICKSEAL 4OZ TRANSLUC SCREW CAP STRL BLU SPECI PNEUM TUBE SYS (SPECIMEN COLLECTION SUPPLIES) ×1 IMPLANT
COUNTER 20 CNT BLOCK ADH NEEDLE STRL LF  RD SHARP FOAM 15.75X11.5X14IN DISP (MED SURG SUPPLIES) ×1 IMPLANT
GLOVE SURG 7 LF  PF BEAD CUF STRL CRM 11.8IN PROTEXIS PI PLISPRN THK9.1 MIL (GLOVES AND ACCESSORIES) ×1 IMPLANT
GOWN SURG XL STD LGTH L3 HKLP CLSR RGLN SLEEVE TWL STRL LF  DISP GRN AERO BLU PRFRM FBRC (DRAPE/PACKS/SHEETS/OR TOWEL) ×2 IMPLANT
LABEL MED CORRECT MED LABELING SYS 4 FLG 2 SHEET 24 PRPRNT STRL (MED SURG SUPPLIES) ×1 IMPLANT
NEEDLE HYPO  18GA 1.5IN REG WL BD PRCSNGL POLYPROP REG BVL LL HUB CLR CD DEHP-FR STRL LF  DISP (MED SURG SUPPLIES) ×1 IMPLANT
NEEDLE HYPO  27GA 1.5IN REG WL PRCSNGL SS POLYPROP REG BVL LL HUB DEHP-FR GRY STRL LF  DISP (MED SURG SUPPLIES) ×1 IMPLANT
PACK SURG ECLIPSE ENT I STRL LF (CUSTOM TRAYS & PACK) ×1 IMPLANT
PEN SURG MRKNG DISP RLR LBL STRL LF  6IN (MED SURG SUPPLIES) ×1 IMPLANT
SOL IRRG 0.9% NACL 1000ML PLASTIC PR BTL ISTNC N-PYRG STRL LF (MEDICATIONS/SOLUTIONS) ×1 IMPLANT
SPONGE GAUZE 4X4IN MDCHC COTTON 12 PLY TY 7 LF  STRL DISP (WOUND CARE SUPPLY) ×2 IMPLANT
SUTURE 3-0 FS2 VICRYL+ 27IN UNDYED BRD ANBCTRL COAT ABS (SUTURE/WOUND CLOSURE) ×1 IMPLANT
SUTURE 4-0 P-3 COAT VICRYL 18IN UNDYED BRD ABS (SUTURE/WOUND CLOSURE) ×2 IMPLANT
SYRINGE GENTLCARE 60CC STRL CATH TIP PEEL PCH SUCT TRANSLUC BULB POLYPROP THRMPLST ELASTO DISP (MED SURG SUPPLIES) ×1 IMPLANT
SYRINGE LL 10ML LF  STRL GRAD N-PYRG DEHP-FR PVC FREE MED DISP (MED SURG SUPPLIES) ×1 IMPLANT
TOWEL 24X16IN COTTON BLU DISP SURG STRL LF (DRAPE/PACKS/SHEETS/OR TOWEL) ×2 IMPLANT
TUBING SUCT CLR 6FT .25IN ARGYLE PVC NCDTV STR MALE FEMALE MLD CONN STRL LF (MED SURG SUPPLIES) ×1 IMPLANT

## 2024-01-11 NOTE — Anesthesia Postprocedure Evaluation (Signed)
 Anesthesia Post Op Evaluation    Patient: Raymond Myers  Procedure(s):  REPAIR OF MOHS DEFECT LEFT CENTRAL MALAR CHEEK WITH FLAP CLOSURE    Last Vitals:Temperature: 36.2 C (97.2 F) (01/11/24 1424)  Heart Rate: 76 (01/11/24 1424)  BP (Non-Invasive): (!) 165/76 (01/11/24 1424)  Respiratory Rate: 19 (01/11/24 1424)  SpO2: 97 % (01/11/24 1424)    No notable events documented.    Patient is sufficiently recovered from the effects of anesthesia to participate in the evaluation and has returned to their pre-procedure level.  Patient location during evaluation: PACU       Patient participation: complete - patient participated  Level of consciousness: awake and alert and responsive to verbal stimuli    Pain management: adequate  Airway patency: patent    Anesthetic complications: no  Cardiovascular status: acceptable  Respiratory status: acceptable  Hydration status: acceptable  Patient post-procedure temperature: Pt Normothermic   PONV Status: Absent

## 2024-01-11 NOTE — OR Surgeon (Signed)
 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 who presents with the aforementioned defect and diagnosis. Risks, benefits, indications, alternatives and complications of procedure were discussed in great detail. Patient understands and would like to proceed. Informed signed consent obtained.  Risks include but are not limited to bleeding, infection, scarring, deformity, nerve injury and the possible need for a multistage procedure.            Description of Procedure:  After informed consent was reviewed and confirmed in the preoperative holding area the patient was transferred to the operating suite and placed on the table in the supine position.  The patient was correctly identified by name as well as wrist band.  Appropriate timeout was taken prior to the start of the procedure.  Next the anesthesiologist successfully induced and intubated the patient.  The table was then rotated 90 away from the anesthesia circuit. Next, the patient was then prepped and draped in a sterile fashion.  Thorough evaluation of the defect and planned incisions for a rotational advancement flap was drawn with a skin  marker.  1% lidocaine  with 100,000 epinephrine was injected into the preplanned incisions.  Using #15 blade skin incisions/ back cut was made.  Next adequate undermining along the periphery including elevation and mobilization of the flap encompassed 80 cm2 of total soft tissue rearrangement.  The flap was then rotated and advanced into the defect in a preplanned position.  Multiple deep dermal and subcutaneous tacking sutures with 4-0 and 5-0 Biosyn in a buried interrupted fashion was performed.  Donor site standing cutaneous deformity was resolved by removal of a burrows triangle to improve transitions.  Skin was closed with running 6-0 fast gut as well as a few strategically position simple interrupted 6-0 fast gut sutures.  Bactroban ointment was applied to wound.  Patient's care was then transferred to the anesthesiologist, patient was awoken and transferred to the postanesthesia care unit in stable condition.  All instrument needle and sponge counts were deemed correct at the end of the case.    [name], D.O. ,M.M.S.   Otolaryngology/Facial Plastic Surgery

## 2024-01-11 NOTE — Nurses Notes (Signed)
 Patient transported to [facility]. Patient's vitals: 97.90F  temp, 92% room air oxygen saturation, 75 HR, 18 RR, 160/69 BP. Patient's dressing and IV site both dry and intact. Patient's care endorsed at bedside to Rhoda Mayor, RN.

## 2024-01-11 NOTE — Anesthesia Transfer of Care (Signed)
 ANESTHESIA TRANSFER OF CARE   Raymond Myers is a 78 y.o. ,male, Weight: 101 kg (223 lb)   had Procedure(s):  REPAIR OF MOHS DEFECT LEFT CENTRAL MALAR CHEEK WITH FLAP CLOSURE  performed  01/11/24   Primary Service: [name], DO    Past Medical History:   Diagnosis Date   . Gout    . H/O hearing loss    . Hypercholesterolemia    . Hyperlipidemia    . Hypertension    . Peripheral neuropathy    . Shortness of breath       Allergy History as of 01/11/24       LISINOPRIL         Noted Status Severity Type Reaction    07/05/22 0947 Claudene Fails, RN 07/05/22 Active High  Swelling    07/05/22 0947 Claudene Fails, RN 07/05/22 Active High  Shortness of Breath              SULFA (SULFONAMIDES)         Noted Status Severity Type Reaction    01/08/24 1358 Isadora Moats, [place] 02/25/08 Active High  Rash    Comments: Other Reaction(s): Not available, Other - See Comments, Unknown    Mother stated he was allergic as a child and he has never taken.     07/05/22 0947 Claudene Fails, RN 07/05/22 Active Medium  Rash              INDOMETHACIN         Noted Status Severity Type Reaction    03/29/23 0950 Waddell Norris, LPN 88/95/86 Active High  Anaphylaxis    Comments: Other Reaction(s): Not available    Tongue, Face, lips swollen, no shortness of breath               ACE INHIBITORS         Noted Status Severity Type Reaction    01/08/24 1358 Isadora Moats, [place] 05/01/13 Active High  Swelling    Comments: Other Reaction(s): Angioedema, Not available, Other, Other (See Comments), Unknown    syncope    Syncope    lisinopril    Product containing angiotensin-converting enzyme inhibitor (product)               CODEINE         Noted Status Severity Type Reaction    01/08/24 1358 Isadora Moats, [place] 08/05/23 Active       Comments: Other Reaction(s): Unknown               HOUSE DUST MITE         Noted Status Severity Type Reaction    01/08/24 1358 Isadora Moats, [place] 01/08/24 Active       Comments: Other Reaction(s): Not available                    I completed my transfer of care / handoff to the receiving personnel during which we discussed:  Access, Airway, All key/critical aspects of case discussed, Analgesia, Antibiotics, Expectation of post procedure, Fluids/Product, Gave opportunity for questions and acknowledgement of understanding, Labs and PMHx      Post Location: PACU  Last OR Temp: Temperature: 36.2 C (97.2 F)      Airway:* No LDAs found *  Blood pressure (!) 165/76, pulse 76, temperature 36.2 C (97.2 F), resp. rate 19, height 1.854 m (6' 1), weight 101 kg (223 lb), SpO2 97%.

## 2024-01-11 NOTE — Anesthesia Preprocedure Evaluation (Addendum)
 ANESTHESIA PRE-OP EVALUATION  Planned Procedure: REPAIR OF MOHS DEFECT LEFT CENTRAL MALAR CHEEK WITH FLAP CLOSURE; POSSIBLE SKIN GRAFT (Left: Face)  Review of Systems     anesthesia history negative               Pulmonary   shortness of breath, past history of smoking  and current smoker (chew this am around 730),   Cardiovascular    Hypertension, CAD, ECG reviewed, Heart cath 06/2022 reviewed Moderate non-obstructive CAD, confirmed with FFR LAD and rPDA  Normal LV systolic function  denies recent chest pain and hyperlipidemia ,No peripheral edema,  Exercise Tolerance: > or = 4 METS   ,beta blocker therapy  ,taken in last 24 hours     GI/Hepatic/Renal    GERD        Endo/Other    hypothyroidism,      Neuro/Psych/MS    Subdural hematoma Jan 2024    peripheral neuropathy,  Cancer    negative hematology/oncology ROS,                   Physical Assessment      Airway       Mallampati: III    TM distance: >3 FB    Neck ROM: full  Mouth Opening: good.  Facial hair          Dental       Dentition intact             Pulmonary    Breath sounds clear to auscultation  (-) no rhonchi, no decreased breath sounds, no wheezes, no rales and no stridor     Cardiovascular    Rhythm: regular  Rate: Normal  (-) no friction rub, carotid bruit is not present, no peripheral edema and no murmur     Other findings            Plan  ASA 3     Planned anesthesia type: general                         Intravenous induction     Anesthesia issues/risks discussed are: Dental Injuries, PONV, Cardiac Events/MI, Intraoperative Awareness/ Recall, Stroke and Aspiration.  Anesthetic plan and risks discussed with patient  signed consent obtained          Patient's NPO status is appropriate for Anesthesia.           (Surgery held til 3 because of chewing tobacco)

## 2024-01-11 NOTE — Discharge Instructions (Signed)
 Clean area four times a day (breakfast,lunch,dinner and bedtime).     NO double dipping in saline solution.    Apply ointment after each cleaning. Be genrous.    DO NOT let shower directly hit suture line.     Follow any other instructions given by Dr.    Rosalea NOT shave.

## 2024-01-11 NOTE — Interval H&P Note (Signed)
 [facility]      H&P UPDATE FORM                                                                                  Raymond Myers, Raymond Myers, 78 y.o. male  Date of Admission:  01/11/2024  Date of Birth:  22-May-1945    01/11/2024    STOP: IF H&P IS GREATER THAN 30 DAYS FROM SURGICAL DAY COMPLETE NEW H&P IS REQUIRED.     H & P updated the day of the procedure.  1.  H&P completed within 30 days of surgical procedure and has been reviewed within 24 hours of admission but prior to surgery or a procedure requiring anesthesia services, the patient has been examined, and no change has occured in the patients condition since the H&P was completed.       Change in medications: No              Comments:     2.  Patient continues to be appropriate candidate for planned surgical procedure. YES    Raymond Alvine, DO

## 2024-01-11 NOTE — OR PreOp (Signed)
 ONEIDA Blazer RN updated JINNY Hamilton in daysurgery patient's case will be delayed due to having oral tobacco this AM. 8 hour delay. Patient transported back to daysurgery 2 by SHAUNNA Naegeli RN.

## 2024-01-11 NOTE — Addendum Note (Signed)
 Addendum  created 01/11/24 1429 by Charlott Agent, MD    Review and Sign - Ready for Procedure

## 2024-01-16 ENCOUNTER — Telehealth (INDEPENDENT_AMBULATORY_CARE_PROVIDER_SITE_OTHER): Payer: Self-pay | Admitting: OTOLARYNGOLOGY

## 2024-01-16 NOTE — Nursing Note (Signed)
 He left a message wondering if he had a follow up appointment i gave the appointment time and date on his machine. 02/02/2024 9:45 am told him to call if he needed anything further.

## 2024-02-02 ENCOUNTER — Ambulatory Visit: Payer: Self-pay | Attending: OTOLARYNGOLOGY | Admitting: OTOLARYNGOLOGY

## 2024-02-02 ENCOUNTER — Other Ambulatory Visit: Payer: Self-pay

## 2024-02-02 ENCOUNTER — Encounter (INDEPENDENT_AMBULATORY_CARE_PROVIDER_SITE_OTHER): Payer: Self-pay | Admitting: OTOLARYNGOLOGY

## 2024-02-02 VITALS — Ht 73.0 in | Wt 223.0 lb

## 2024-02-02 DIAGNOSIS — C44329 Squamous cell carcinoma of skin of other parts of face: Secondary | ICD-10-CM | POA: Insufficient documentation

## 2024-02-02 DIAGNOSIS — L578 Other skin changes due to chronic exposure to nonionizing radiation: Secondary | ICD-10-CM | POA: Insufficient documentation

## 2024-02-02 DIAGNOSIS — J3089 Other allergic rhinitis: Secondary | ICD-10-CM | POA: Insufficient documentation

## 2024-02-02 DIAGNOSIS — Z48817 Encounter for surgical aftercare following surgery on the skin and subcutaneous tissue: Secondary | ICD-10-CM

## 2024-04-17 ENCOUNTER — Ambulatory Visit: Attending: OTOLARYNGOLOGY | Admitting: OTOLARYNGOLOGY

## 2024-04-17 ENCOUNTER — Other Ambulatory Visit: Payer: Self-pay

## 2024-04-17 ENCOUNTER — Other Ambulatory Visit (INDEPENDENT_AMBULATORY_CARE_PROVIDER_SITE_OTHER): Payer: Self-pay | Admitting: OTOLARYNGOLOGY

## 2024-04-17 ENCOUNTER — Encounter (INDEPENDENT_AMBULATORY_CARE_PROVIDER_SITE_OTHER): Payer: Self-pay | Admitting: OTOLARYNGOLOGY

## 2024-04-17 VITALS — Ht 73.0 in | Wt 223.0 lb

## 2024-04-17 DIAGNOSIS — L989 Disorder of the skin and subcutaneous tissue, unspecified: Secondary | ICD-10-CM

## 2024-04-17 DIAGNOSIS — L578 Other skin changes due to chronic exposure to nonionizing radiation: Secondary | ICD-10-CM

## 2024-04-17 DIAGNOSIS — J3089 Other allergic rhinitis: Secondary | ICD-10-CM

## 2024-04-17 DIAGNOSIS — C44329 Squamous cell carcinoma of skin of other parts of face: Secondary | ICD-10-CM

## 2024-04-17 NOTE — H&P (Cosign Needed)
 ENT, [facility]  [address]  [place] [address]  Operated by [facility]      Name: Raymond Myers MRN:  [id number]   Date: 04/17/2024 DOB: 14-Feb-1946 (78 y.o.)       Referring Provider:  Ezra Vicenta JONETTA PONCE, PA    Reason for Visit:   Chief Complaint   Patient presents with    Follow Up 3 Months     Recheck SCC of skin left cheek   Pt nose need to be looked at   Area is getting larger           History of Present Illness:  Raymond Myers is a 79 y.o. male who is FU on SCCA L cheek. The area has healed well. No concerns. Now c/o increased growth of rhinophyma of the nose. He also is having Derm look at the right auricular skin lesion.       Patient History:  Problem List[1]  Current Outpatient Medications   Medication Sig    allopurinoL (ZYLOPRIM) 300 mg Oral Tablet Take 1 Tablet (300 mg total) by mouth Daily    amLODIPine (NORVASC) 10 mg Oral Tablet Take 1 Tablet (10 mg total) by mouth Daily    atorvastatin  (LIPITOR) 40 mg Oral Tablet Take 0.5 Tablets (20 mg total) by mouth Every evening Indications: hardening of the arteries due to plaque buildup    cholecalciferol, vitamin D3, 25 mcg (1,000 unit) Oral Tablet Take 1 Tablet (1,000 Units total) by mouth Daily    donepeziL (ARICEPT) 10 mg Oral Tablet Take 1 Tablet (10 mg total) by mouth Every night    doxazosin (CARDURA) 8 mg Oral Tablet Take 1 Tablet (8 mg total) by mouth Daily    DULoxetine (CYMBALTA DR) 60 mg Oral Capsule, Delayed Release(E.C.) Take 1 Capsule (60 mg total) by mouth Daily    EPINEPHrine  (EPIPEN  2-PAK) 0.3 mg/0.3 mL Injection Auto-Injector Inject 0.3 mL (0.3 mg total) into the muscle Every 10 minutes as needed for ANAPHYLAXIS    famotidine (PEPCID) 40 mg Oral Tablet Take 1 Tablet (40 mg total) by mouth Twice daily    fluticasone propionate (FLONASE) 50 mcg/actuation Nasal Spray, Suspension Administer 1 Spray into each nostril Once per day as needed    hydrALAZINE (APRESOLINE) 25 mg Oral Tablet Take 0.5  Tablets (12.5 mg total) by mouth Every evening    loratadine (CLARITIN) 10 mg Oral Tablet Take 1 Tablet (10 mg total) by mouth Daily    multivitamin with folic acid (THERA) 400 mcg Oral Tablet Take 1 Tablet by mouth Daily    mupirocin  (BACTROBAN ) 2 % Ointment Apply topically Three times a day    NETTLE LEAF ORAL Take 2 Capsules by mouth Twice per day as needed    omeprazole (PRILOSEC) 40 mg Oral Capsule, Delayed Release(E.C.) Take 1 Capsule (40 mg total) by mouth Twice daily    potassium chloride (K-DUR) 20 mEq Oral Tab Sust.Rel. Particle/Crystal Take 1 Tablet (20 mEq total) by mouth Daily    terbinafine HCL (LAMISIL AT) 1 % Cream Apply topically Twice per day as needed    UNKNOWN MEDICATION (UNKNOWN MEDICATION) Take by mouth Every night as needed (TAKE 1 TO 1 AND 1/2 TEASPOONS)      Allergies[2]  Past Medical History:   Diagnosis Date    Gout     H/O hearing loss     Hypercholesterolemia     Hyperlipidemia     Hypertension     Peripheral neuropathy  Shortness of breath      Past Surgical History:   Procedure Laterality Date    BURR HOLE FOR SUBDURAL HEMATOMA  03/2022    CATARACT EXTRACTION W/  INTRAOCULAR LENS IMPLANT Bilateral     HX APPENDECTOMY      HX CARPAL TUNNEL RELEASE Bilateral     HX HIP REPLACEMENT       Family Medical History:       Problem Relation (Age of Onset)    Heart Attack Father            Social History[3]    Review of Systems:  Review of Systems    Physical Exam:  Ht 1.854 m (6' 1)   Wt 101 kg (223 lb)   BMI 29.42 kg/m       Physical Exam  HENT:      Head:      Comments: L cheek WHSS     Ears:        Comments: R auricular raised skin lesion  Neurological:      Mental Status: He is alert.          Assessment:  ENCOUNTER DIAGNOSES     ICD-10-CM   1. 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 Non-seasonal allergic rhinitis due to other allergic trigger  J30.89       Plan:  Medical records reviewed on 04/17/2024.  Pt prefers Derm to look  at the R auricular lesion. L cheek well healed.   No orders of the defined types were placed in this encounter.    No follow-ups on file.      Raymond SHAUNNA Alvine, PA-C  The advanced practice clinician's documentation was reviewed and amended in its entirety.  The patient was seen by me and examined as part of a shared service with the advanced practice clinician. The patient was clinically evaluated, and all pertinent records, lab results and imaging were reviewed.  I agree with the advanced practice clinicians documentation, which reflects my medical decision making.  I personally spent more than 50% of the encounter with direct patient care including: obtaining history, performing physical exam, counseling the patient and family, and directing medical decision making.    Raymond Myers, D.O., MMS  ENT / Facial Plastic Surgery    I appreciate the opportunity to be involved in the care of your patients.  If you have any questions or concerns regarding this encounter, please do not hesitate to contact me at your convenience.     This note may have been partially generated using MModal Fluency Direct system, and there may be some incorrect words, spellings, and punctuation that were not noted in checking the note before saving, though effort was made to avoid such errors.       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    Vitamin D deficiency    Tobacco user    Bilateral tinnitus    Traumatic subdural hemorrhage without loss of consciousness, initial encounter (CMS HCC)    Stricture of esophagus    Skin irritation    Sigmoid diverticulosis    Shortness of breath    Seasonal allergies    Proteinuria    Peripheral neuropathy    No pertinent family history    Hyperlipidemia    Low back pain  Intracranial subarachnoid hemorrhage (CMS HCC)    Impaired fasting glucose    Impacted cerumen, unspecified ear    Hypothyroidism     Gout    Hypertensive heart disease without heart failure    History of colonoscopy    History of colonic polyps    History of actinic keratosis    Gastroesophageal reflux disease without esophagitis    Gastric polyp    Former smoker    Forgetfulness    Exposure to potentially hazardous substance    Hypertension    Food in esophagus causing other injury, initial encounter    Encounter for preventive health examination    Diarrhea, unspecified    Degeneration of intervertebral disc of lumbar region    Cigarette smoker    Allergic urticaria    Chronic pain disorder    Chronic hypopotassemia    Carpal tunnel syndrome of left wrist    Bradycardia    Basal cell carcinoma of skin, unspecified    Angiotensin converting enzyme inhibitor-aggravated angioedema    Allergic rhinitis due to house dust mite    Allergic rhinitis    Actinic keratosis    Rhinophyma    Other rosacea    Neoplasm of uncertain behavior of skin    Does not use smokeless tobacco    Does not use illicit drugs    Current drinker of alcohol     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    Codeine      Other Reaction(s): Unknown    House Dust Mite      Other Reaction(s): Not available   [3]   Social History  Tobacco Use    Smoking status: Former     Types: Cigarettes    Smokeless tobacco: Current     Types: Chew    Tobacco comments:     BAG LASTS ABOUT ONE DAY   Vaping Use    Vaping status: Never  Used   Substance Use Topics    Alcohol  use: Yes     Comment: 2 BEERS 2-3 SHOTS OF LIQUOR DAILY    Drug use: Never

## 2024-05-07 ENCOUNTER — Ambulatory Visit (INDEPENDENT_AMBULATORY_CARE_PROVIDER_SITE_OTHER): Payer: Self-pay | Admitting: OTOLARYNGOLOGY
